# Patient Record
Sex: Female | Born: 1995 | Race: White | Hispanic: No | Marital: Single
Health system: Midwestern US, Community
[De-identification: ages and names within clinical notes are randomized; demographics above are authoritative.]

## PROBLEM LIST (undated history)

## (undated) DIAGNOSIS — L732 Hidradenitis suppurativa: Secondary | ICD-10-CM

## (undated) DIAGNOSIS — F319 Bipolar disorder, unspecified: Secondary | ICD-10-CM

## (undated) DIAGNOSIS — E669 Obesity, unspecified: Secondary | ICD-10-CM

## (undated) DIAGNOSIS — E785 Hyperlipidemia, unspecified: Secondary | ICD-10-CM

## (undated) DIAGNOSIS — N3944 Nocturnal enuresis: Secondary | ICD-10-CM

## (undated) DIAGNOSIS — N915 Oligomenorrhea, unspecified: Secondary | ICD-10-CM

## (undated) DIAGNOSIS — R011 Cardiac murmur, unspecified: Secondary | ICD-10-CM

## (undated) DIAGNOSIS — L680 Hirsutism: Secondary | ICD-10-CM

## (undated) HISTORY — DX: Obesity, unspecified: E66.9

## (undated) HISTORY — DX: Cardiac murmur, unspecified: R01.1

## (undated) HISTORY — DX: Bipolar disorder, unspecified: F31.9

## (undated) HISTORY — DX: Nocturnal enuresis: N39.44

## (undated) HISTORY — DX: Hirsutism: L68.0

## (undated) HISTORY — DX: Oligomenorrhea, unspecified: N91.5

## (undated) HISTORY — DX: Hidradenitis suppurativa: L73.2

## (undated) HISTORY — DX: Hyperlipidemia, unspecified: E78.5

---
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
## 2015-03-31 ENCOUNTER — Emergency Department (HOSPITAL_COMMUNITY)
Admission: EM | Admit: 2015-03-31 | Discharge: 2015-03-31 | Discharge status: Against Medical Advice | Payer: Medicaid Other | Attending: Emergency Medicine | Admitting: Emergency Medicine

## 2015-03-31 ENCOUNTER — Encounter (HOSPITAL_COMMUNITY): Payer: Self-pay | Admitting: Family Medicine

## 2015-03-31 DIAGNOSIS — R110 Nausea: Secondary | ICD-10-CM | POA: Insufficient documentation

## 2015-03-31 DIAGNOSIS — R39198 Other difficulties with micturition: Secondary | ICD-10-CM | POA: Diagnosis not present

## 2015-03-31 DIAGNOSIS — E669 Obesity, unspecified: Secondary | ICD-10-CM | POA: Insufficient documentation

## 2015-03-31 DIAGNOSIS — Z3202 Encounter for pregnancy test, result negative: Secondary | ICD-10-CM | POA: Diagnosis not present

## 2015-03-31 DIAGNOSIS — R109 Unspecified abdominal pain: Secondary | ICD-10-CM | POA: Insufficient documentation

## 2015-03-31 DIAGNOSIS — Z5321 Procedure and treatment not carried out due to patient leaving prior to being seen by health care provider: Secondary | ICD-10-CM

## 2015-03-31 LAB — COMPREHENSIVE METABOLIC PANEL
ALBUMIN: 3.4 g/dL — AB (ref 3.5–5.0)
ALT: 21 U/L (ref 14–54)
AST: 24 U/L (ref 15–41)
Alkaline Phosphatase: 60 U/L (ref 38–126)
Anion gap: 12 (ref 5–15)
BILIRUBIN TOTAL: 0.3 mg/dL (ref 0.3–1.2)
BUN: 9 mg/dL (ref 6–20)
CHLORIDE: 104 mmol/L (ref 101–111)
CO2: 23 mmol/L (ref 22–32)
CREATININE: 0.79 mg/dL (ref 0.44–1.00)
Calcium: 9.3 mg/dL (ref 8.9–10.3)
GFR calc Af Amer: >60 mL/min (ref >60)
GLUCOSE: 126 mg/dL — AB (ref 65–99)
Potassium: 3.8 mmol/L (ref 3.5–5.1)
Sodium: 139 mmol/L (ref 135–145)
Total Protein: 7 g/dL (ref 6.5–8.1)

## 2015-03-31 LAB — CBC
HCT: 37.3 % (ref 36.0–46.0)
Hemoglobin: 12.1 g/dL (ref 12.0–15.0)
MCH: 28.5 pg (ref 26.0–34.0)
MCHC: 32.4 g/dL (ref 30.0–36.0)
MCV: 88 fL (ref 78.0–100.0)
Platelets: 419 10*3/uL — ABNORMAL HIGH (ref 150–400)
RBC: 4.24 MIL/uL (ref 3.87–5.11)
RDW: 13.6 % (ref 11.5–15.5)
WBC: 11.6 10*3/uL — AB (ref 4.0–10.5)

## 2015-03-31 LAB — I-STAT BETA HCG BLOOD, ED (MC, WL, AP ONLY): I-stat hCG, quantitative: <5 m[IU]/mL (ref <5)

## 2015-03-31 LAB — LIPASE, BLOOD: LIPASE: 19 U/L (ref 11–51)

## 2015-03-31 NOTE — ED Notes (Signed)
No answer for room ([facility]

## 2015-03-31 NOTE — ED Notes (Signed)
Called pt to be roomed 3X more, no response. Unable to find pt in department at this time.

## 2015-03-31 NOTE — ED Notes (Signed)
Pt here for flank pain and nausea since last night. sts some burning with urination. Denies vaginal bleeding or discharge.

## 2015-04-03 NOTE — ED Provider Notes (Signed)
Appears, per nursing notes, the patient left without being seen.   Marily MemosJason Canden Cieslinski, MD 04/03/15 1739

## 2015-08-24 ENCOUNTER — Ambulatory Visit (INDEPENDENT_AMBULATORY_CARE_PROVIDER_SITE_OTHER): Payer: BLUE CROSS/BLUE SHIELD

## 2015-08-24 ENCOUNTER — Ambulatory Visit (INDEPENDENT_AMBULATORY_CARE_PROVIDER_SITE_OTHER): Payer: BLUE CROSS/BLUE SHIELD | Admitting: Physician Assistant

## 2015-08-24 VITALS — BP 122/76 | HR 112 | Temp 98.7°F | Resp 18 | Ht 66.75 in | Wt 292.0 lb

## 2015-08-24 DIAGNOSIS — Z111 Encounter for screening for respiratory tuberculosis: Secondary | ICD-10-CM

## 2015-08-24 DIAGNOSIS — N926 Irregular menstruation, unspecified: Secondary | ICD-10-CM

## 2015-08-24 DIAGNOSIS — R918 Other nonspecific abnormal finding of lung field: Secondary | ICD-10-CM | POA: Diagnosis not present

## 2015-08-24 LAB — POCT URINE PREGNANCY: Preg Test, Ur: NEGATIVE

## 2015-08-24 NOTE — Patient Instructions (Signed)
Consider quantiferon gold test next year

## 2015-08-24 NOTE — Progress Notes (Signed)
[facility] [address] [place] [address] [phone]  Date:  08/24/2015   Name:  Christy Ford   DOB:  May 30, 1995   MRN:  [id number]  PCP:  Gaye Alken, MD    Chief Complaint: Chest Xray   History of Present Illness:  This is a 20 y.o. female with PMH substance abuse who is presenting stating she needs a chest xray to screen for tb. She has apparently been positive to ppd 4 times in the past, each time with a negative xray. One provider before suggested she may be allergic to the ppd and that's why she reacts each time. She has been instructed by her job to get a chest xray for screening. She denies cough, fever, chills, night sweats, chest pain, wt loss.  Review of Systems:  Review of Systems See HPOI  Patient Active Problem List   Diagnosis Date Noted  . Primary nocturnal enuresis 05/07/2012  . Obesity   . Hyperlipidemia   . Hirsutism   . Oligomenorrhea     Prior to Admission medications   Medication Sig Start Date End Date Taking? Authorizing Provider  desmopressin (DDAVP) 0.1 MG tablet Take 1 tablet (0.1 mg total) by mouth daily. Patient not taking: Reported on 08/24/2015 05/07/12   Dessa Phi, MD  metFORMIN (GLUCOPHAGE) 500 MG tablet Take 1 tablet (500 mg total) by mouth 2 (two) times daily with a meal. Patient not taking: Reported on 08/24/2015 05/07/12   Dessa Phi, MD    Allergies  Allergen Reactions  . Amoxicillin Nausea Only  . Hctz [Hydrochlorothiazide] Nausea And Vomiting  . Penicillins Nausea Only  . Ppd [Tuberculin Purified Protein Derivative]   . Latex Itching and Rash    No past surgical history on file.  Social History  Substance Use Topics  . Smoking status: Former Smoker    Quit date: 05/17/2014  . Smokeless tobacco: Never Used  . Alcohol Use: No    Family History  Problem Relation Age of Onset  . Obesity Mother   . Diabetes Father   . Hypertension Father   . Diabetes Paternal Uncle   .  Hypertension Paternal Uncle   . Obesity Paternal Uncle     Medication list has been reviewed and updated.  Physical Examination:  Physical Exam  Constitutional: She is oriented to person, place, and time. She appears well-developed and well-nourished. No distress.  HENT:  Head: Normocephalic and atraumatic.  Right Ear: Hearing normal.  Left Ear: Hearing normal.  Nose: Nose normal.  Eyes: Conjunctivae and lids are normal. Right eye exhibits no discharge. Left eye exhibits no discharge. No scleral icterus.  Pulmonary/Chest: Effort normal. No respiratory distress.  Musculoskeletal: Normal range of motion.  Neurological: She is alert and oriented to person, place, and time.  Skin: Skin is warm, dry and intact. No lesion and no rash noted.  Psychiatric: She has a normal mood and affect. Her speech is normal and behavior is normal. Thought content normal.   BP 122/76 mmHg  Pulse 112  Temp(Src) 98.7 F (37.1 C) (Oral)  Resp 18  Ht 5' 6.75" (1.695 m)  Wt 292 lb (132.45 kg)  BMI 46.10 kg/m2  SpO2 97%  Results for orders placed or performed in visit on 08/24/15  POCT urine pregnancy  Result Value Ref Range   Preg Test, Ur Negative Negative   Assessment and Plan:  1. Irregular menstrual cycle 2. Screening pulmonary-TB Chest xray negative. I have suggested that patient get  quantiferon gold next year if her job will accept that, so that we reduce her radiation risk. - POCT urine pregnancy - DG Chest 2 View; Future   [place] MinersNicole V. Dyke BrackettBush, PA-C, MHS [facility]  08/24/2015

## 2016-03-07 DIAGNOSIS — J0110 Acute frontal sinusitis, unspecified: Secondary | ICD-10-CM | POA: Diagnosis not present

## 2016-03-07 DIAGNOSIS — R05 Cough: Secondary | ICD-10-CM | POA: Diagnosis not present

## 2016-03-15 DIAGNOSIS — J0100 Acute maxillary sinusitis, unspecified: Secondary | ICD-10-CM | POA: Diagnosis not present

## 2016-03-15 DIAGNOSIS — R05 Cough: Secondary | ICD-10-CM | POA: Diagnosis not present

## 2016-07-22 DIAGNOSIS — R51 Headache: Secondary | ICD-10-CM | POA: Diagnosis not present

## 2016-07-22 DIAGNOSIS — H53149 Visual discomfort, unspecified: Secondary | ICD-10-CM | POA: Diagnosis not present

## 2016-07-22 DIAGNOSIS — H538 Other visual disturbances: Secondary | ICD-10-CM | POA: Diagnosis not present

## 2016-07-22 DIAGNOSIS — H547 Unspecified visual loss: Secondary | ICD-10-CM | POA: Diagnosis not present

## 2016-07-22 DIAGNOSIS — H5712 Ocular pain, left eye: Secondary | ICD-10-CM | POA: Diagnosis not present

## 2016-07-22 DIAGNOSIS — H578 Other specified disorders of eye and adnexa: Secondary | ICD-10-CM | POA: Diagnosis not present

## 2016-07-24 DIAGNOSIS — H1132 Conjunctival hemorrhage, left eye: Secondary | ICD-10-CM | POA: Diagnosis not present

## 2016-07-24 DIAGNOSIS — H34821 Venous engorgement, right eye: Secondary | ICD-10-CM | POA: Diagnosis not present

## 2016-07-24 DIAGNOSIS — H539 Unspecified visual disturbance: Secondary | ICD-10-CM | POA: Diagnosis not present

## 2016-11-19 DIAGNOSIS — Z720 Tobacco use: Secondary | ICD-10-CM | POA: Diagnosis not present

## 2016-11-19 DIAGNOSIS — R0683 Snoring: Secondary | ICD-10-CM | POA: Diagnosis not present

## 2016-11-19 DIAGNOSIS — G932 Benign intracranial hypertension: Secondary | ICD-10-CM | POA: Diagnosis not present

## 2016-12-05 DIAGNOSIS — E785 Hyperlipidemia, unspecified: Secondary | ICD-10-CM | POA: Diagnosis not present

## 2016-12-05 DIAGNOSIS — Z713 Dietary counseling and surveillance: Secondary | ICD-10-CM | POA: Diagnosis not present

## 2016-12-16 DIAGNOSIS — F3175 Bipolar disorder, in partial remission, most recent episode depressed: Secondary | ICD-10-CM | POA: Diagnosis not present

## 2016-12-16 DIAGNOSIS — F4310 Post-traumatic stress disorder, unspecified: Secondary | ICD-10-CM | POA: Diagnosis not present

## 2016-12-16 DIAGNOSIS — Z7189 Other specified counseling: Secondary | ICD-10-CM | POA: Diagnosis not present

## 2016-12-20 DIAGNOSIS — F3175 Bipolar disorder, in partial remission, most recent episode depressed: Secondary | ICD-10-CM | POA: Diagnosis not present

## 2016-12-20 DIAGNOSIS — E785 Hyperlipidemia, unspecified: Secondary | ICD-10-CM | POA: Diagnosis not present

## 2016-12-20 DIAGNOSIS — G932 Benign intracranial hypertension: Secondary | ICD-10-CM | POA: Diagnosis not present

## 2016-12-24 DIAGNOSIS — J45909 Unspecified asthma, uncomplicated: Secondary | ICD-10-CM | POA: Diagnosis not present

## 2016-12-24 DIAGNOSIS — R0683 Snoring: Secondary | ICD-10-CM | POA: Diagnosis not present

## 2016-12-24 DIAGNOSIS — G4719 Other hypersomnia: Secondary | ICD-10-CM | POA: Diagnosis not present

## 2016-12-24 DIAGNOSIS — F319 Bipolar disorder, unspecified: Secondary | ICD-10-CM | POA: Diagnosis not present

## 2017-01-06 DIAGNOSIS — N926 Irregular menstruation, unspecified: Secondary | ICD-10-CM | POA: Diagnosis not present

## 2017-01-06 DIAGNOSIS — Z131 Encounter for screening for diabetes mellitus: Secondary | ICD-10-CM | POA: Diagnosis not present

## 2017-01-06 DIAGNOSIS — Z118 Encounter for screening for other infectious and parasitic diseases: Secondary | ICD-10-CM | POA: Diagnosis not present

## 2017-01-06 DIAGNOSIS — Z01419 Encounter for gynecological examination (general) (routine) without abnormal findings: Secondary | ICD-10-CM | POA: Diagnosis not present

## 2017-01-06 DIAGNOSIS — Z1329 Encounter for screening for other suspected endocrine disorder: Secondary | ICD-10-CM | POA: Diagnosis not present

## 2017-01-06 DIAGNOSIS — Z0000 Encounter for general adult medical examination without abnormal findings: Secondary | ICD-10-CM | POA: Diagnosis not present

## 2017-01-06 DIAGNOSIS — Z13220 Encounter for screening for lipoid disorders: Secondary | ICD-10-CM | POA: Diagnosis not present

## 2017-01-06 DIAGNOSIS — N915 Oligomenorrhea, unspecified: Secondary | ICD-10-CM | POA: Diagnosis not present

## 2017-01-06 DIAGNOSIS — Z6841 Body Mass Index (BMI) 40.0 and over, adult: Secondary | ICD-10-CM | POA: Diagnosis not present

## 2017-01-06 DIAGNOSIS — Z130 Encounter for screening for diseases of the blood and blood-forming organs and certain disorders involving the immune mechanism: Secondary | ICD-10-CM | POA: Diagnosis not present

## 2017-01-23 DIAGNOSIS — Z713 Dietary counseling and surveillance: Secondary | ICD-10-CM | POA: Diagnosis not present

## 2017-01-23 DIAGNOSIS — E785 Hyperlipidemia, unspecified: Secondary | ICD-10-CM | POA: Diagnosis not present

## 2017-01-27 DIAGNOSIS — E282 Polycystic ovarian syndrome: Secondary | ICD-10-CM | POA: Diagnosis not present

## 2017-01-27 DIAGNOSIS — G4719 Other hypersomnia: Secondary | ICD-10-CM | POA: Diagnosis not present

## 2017-01-27 DIAGNOSIS — J45909 Unspecified asthma, uncomplicated: Secondary | ICD-10-CM | POA: Diagnosis not present

## 2017-01-27 DIAGNOSIS — Z6841 Body Mass Index (BMI) 40.0 and over, adult: Secondary | ICD-10-CM | POA: Diagnosis not present

## 2017-01-27 DIAGNOSIS — R0683 Snoring: Secondary | ICD-10-CM | POA: Diagnosis not present

## 2017-01-27 DIAGNOSIS — F17210 Nicotine dependence, cigarettes, uncomplicated: Secondary | ICD-10-CM | POA: Diagnosis not present

## 2017-02-09 DIAGNOSIS — G4710 Hypersomnia, unspecified: Secondary | ICD-10-CM | POA: Diagnosis not present

## 2017-10-27 ENCOUNTER — Encounter (HOSPITAL_COMMUNITY): Payer: Self-pay

## 2017-10-27 ENCOUNTER — Emergency Department (HOSPITAL_COMMUNITY): Payer: BLUE CROSS/BLUE SHIELD

## 2017-10-27 ENCOUNTER — Emergency Department (HOSPITAL_COMMUNITY)
Admission: EM | Admit: 2017-10-27 | Discharge: 2017-10-27 | Disposition: A | Discharge status: Home / Self Care | Payer: BLUE CROSS/BLUE SHIELD | Attending: Emergency Medicine | Admitting: Emergency Medicine

## 2017-10-27 ENCOUNTER — Other Ambulatory Visit: Payer: Self-pay

## 2017-10-27 DIAGNOSIS — Z7984 Long term (current) use of oral hypoglycemic drugs: Secondary | ICD-10-CM | POA: Diagnosis not present

## 2017-10-27 DIAGNOSIS — M549 Dorsalgia, unspecified: Secondary | ICD-10-CM | POA: Insufficient documentation

## 2017-10-27 DIAGNOSIS — S199XXA Unspecified injury of neck, initial encounter: Secondary | ICD-10-CM | POA: Diagnosis not present

## 2017-10-27 DIAGNOSIS — R51 Headache: Secondary | ICD-10-CM | POA: Insufficient documentation

## 2017-10-27 DIAGNOSIS — S0990XA Unspecified injury of head, initial encounter: Secondary | ICD-10-CM | POA: Diagnosis not present

## 2017-10-27 DIAGNOSIS — Y9389 Activity, other specified: Secondary | ICD-10-CM | POA: Diagnosis not present

## 2017-10-27 DIAGNOSIS — S46911A Strain of unspecified muscle, fascia and tendon at shoulder and upper arm level, right arm, initial encounter: Secondary | ICD-10-CM | POA: Diagnosis not present

## 2017-10-27 DIAGNOSIS — M542 Cervicalgia: Secondary | ICD-10-CM | POA: Diagnosis not present

## 2017-10-27 DIAGNOSIS — M25511 Pain in right shoulder: Secondary | ICD-10-CM | POA: Insufficient documentation

## 2017-10-27 DIAGNOSIS — T148XXA Other injury of unspecified body region, initial encounter: Secondary | ICD-10-CM

## 2017-10-27 DIAGNOSIS — Y92410 Unspecified street and highway as the place of occurrence of the external cause: Secondary | ICD-10-CM | POA: Insufficient documentation

## 2017-10-27 DIAGNOSIS — Z87891 Personal history of nicotine dependence: Secondary | ICD-10-CM | POA: Diagnosis not present

## 2017-10-27 DIAGNOSIS — Y999 Unspecified external cause status: Secondary | ICD-10-CM | POA: Diagnosis not present

## 2017-10-27 DIAGNOSIS — S4991XA Unspecified injury of right shoulder and upper arm, initial encounter: Secondary | ICD-10-CM | POA: Diagnosis not present

## 2017-10-27 DIAGNOSIS — S161XXA Strain of muscle, fascia and tendon at neck level, initial encounter: Secondary | ICD-10-CM | POA: Diagnosis not present

## 2017-10-27 DIAGNOSIS — R079 Chest pain, unspecified: Secondary | ICD-10-CM | POA: Diagnosis not present

## 2017-10-27 DIAGNOSIS — S299XXA Unspecified injury of thorax, initial encounter: Secondary | ICD-10-CM | POA: Diagnosis not present

## 2017-10-27 MED ORDER — METHOCARBAMOL 500 MG PO TABS: 500.0000 mg | ORAL_TABLET | Freq: Two times a day (BID) | ORAL | 0 refills | Status: DC

## 2017-10-27 MED ORDER — NAPROXEN 500 MG PO TABS: 500.0000 mg | ORAL_TABLET | Freq: Two times a day (BID) | ORAL | 0 refills | Status: DC

## 2017-10-27 MED ORDER — HYDROCODONE-ACETAMINOPHEN 5-325 MG PO TABS
2.0000 | ORAL_TABLET | Freq: Once | ORAL | Status: AC
  Administered 2017-10-27: 2 via ORAL
  Filled 2017-10-27: qty 2
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
## 2017-11-25 DIAGNOSIS — M25571 Pain in right ankle and joints of right foot: Secondary | ICD-10-CM | POA: Diagnosis not present

## 2017-11-25 DIAGNOSIS — Z6841 Body Mass Index (BMI) 40.0 and over, adult: Secondary | ICD-10-CM | POA: Diagnosis not present

## 2017-12-12 ENCOUNTER — Other Ambulatory Visit: Payer: Self-pay

## 2017-12-12 ENCOUNTER — Encounter (HOSPITAL_COMMUNITY): Payer: Self-pay

## 2017-12-12 DIAGNOSIS — Y9302 Activity, running: Secondary | ICD-10-CM | POA: Diagnosis not present

## 2017-12-12 DIAGNOSIS — Y999 Unspecified external cause status: Secondary | ICD-10-CM | POA: Insufficient documentation

## 2017-12-12 DIAGNOSIS — W2209XA Striking against other stationary object, initial encounter: Secondary | ICD-10-CM | POA: Diagnosis not present

## 2017-12-12 DIAGNOSIS — F1290 Cannabis use, unspecified, uncomplicated: Secondary | ICD-10-CM | POA: Diagnosis not present

## 2017-12-12 DIAGNOSIS — Z87891 Personal history of nicotine dependence: Secondary | ICD-10-CM | POA: Diagnosis not present

## 2017-12-12 DIAGNOSIS — Y9289 Other specified places as the place of occurrence of the external cause: Secondary | ICD-10-CM | POA: Diagnosis not present

## 2017-12-12 DIAGNOSIS — R51 Headache: Secondary | ICD-10-CM | POA: Diagnosis not present

## 2017-12-12 DIAGNOSIS — S0990XA Unspecified injury of head, initial encounter: Secondary | ICD-10-CM | POA: Insufficient documentation

## 2017-12-12 DIAGNOSIS — H532 Diplopia: Secondary | ICD-10-CM | POA: Insufficient documentation

## 2017-12-12 DIAGNOSIS — Z91040 Latex allergy status: Secondary | ICD-10-CM | POA: Insufficient documentation
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
## 2017-12-13 ENCOUNTER — Emergency Department (HOSPITAL_COMMUNITY): Payer: BLUE CROSS/BLUE SHIELD

## 2017-12-13 ENCOUNTER — Emergency Department (HOSPITAL_COMMUNITY)
Admission: EM | Admit: 2017-12-13 | Discharge: 2017-12-13 | Disposition: A | Discharge status: Home / Self Care | Payer: BLUE CROSS/BLUE SHIELD | Attending: Emergency Medicine | Admitting: Emergency Medicine

## 2017-12-13 DIAGNOSIS — S0990XA Unspecified injury of head, initial encounter: Secondary | ICD-10-CM

## 2017-12-13 DIAGNOSIS — R51 Headache: Secondary | ICD-10-CM | POA: Diagnosis not present

## 2017-12-13 MED ORDER — ONDANSETRON 4 MG PO TBDP: 4.0000 mg | ORAL_TABLET | Freq: Three times a day (TID) | ORAL | 0 refills | Status: DC | PRN

## 2017-12-13 MED ORDER — IBUPROFEN 800 MG PO TABS: 800.0000 mg | ORAL_TABLET | Freq: Three times a day (TID) | ORAL | 0 refills | Status: DC

## 2017-12-13 NOTE — ED Provider Notes (Signed)
[facility]-EMERGENCY DEPT Provider Note   CSN: [id number] Arrival date & time: 12/12/17  2336     History   Chief Complaint Chief Complaint  Patient presents with  . Head Injury    HPI Christy Ford is a 22 y.o. female.  The history is provided by the patient and medical records.     22 year old female with history of hyperlipidemia, hirsutism, obesity, presenting to the ED after multiple head injuries.  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  States the first was when she was in a cage, was climbing to the top, fell and struck her head on the ground.  Next incidence also occurred in the cage but ran straight into a metal pole and full speed.  Lastly, she accidentally hit herself in the back of the head with a metal bat.  She denies LOC with any of these episodes but reports as the night has gone on she has started to feel "woozy".  States initially she had some confusion, nausea, vomiting, and blurred vision but those symptoms seem to have improved at this time.  She has not had any focal numbness or weakness.  She is not currently on any anticoagulation.  She does report history of one concussion in the past, many years ago, but never had any ongoing problems with this.  States by the time of my evaluation she mostly just wants to go to sleep.  Past Medical History:  Diagnosis Date  . Hirsutism   . Hyperlipidemia   . Obesity   . Oligomenorrhea   . Primary nocturnal enuresis 05/07/2012    Patient Active Problem List   Diagnosis Date Noted  . Primary nocturnal enuresis 05/07/2012  . Obesity   . Hyperlipidemia   . Hirsutism   . Oligomenorrhea     History reviewed. No pertinent surgical history.   OB History   None      Home Medications    Prior to Admission medications   Medication Sig Start Date End Date Taking? Authorizing Provider  desmopressin  (DDAVP) 0.1 MG tablet Take 1 tablet (0.1 mg total) by mouth daily. Patient not taking: Reported on 08/24/2015 05/07/12   Dessa Phi, MD  metFORMIN (GLUCOPHAGE) 500 MG tablet Take 1 tablet (500 mg total) by mouth 2 (two) times daily with a meal. Patient not taking: Reported on 08/24/2015 05/07/12   Dessa Phi, MD  methocarbamol (ROBAXIN) 500 MG tablet Take 1 tablet (500 mg total) by mouth 2 (two) times daily. 10/27/17   Maxwell Caul, PA-C  naproxen (NAPROSYN) 500 MG tablet Take 1 tablet (500 mg total) by mouth 2 (two) times daily. 10/27/17   Maxwell Caul, PA-C    Family History Family History  Problem Relation Age of Onset  . Obesity Mother   . Diabetes Father   . Hypertension Father   . Diabetes Paternal Uncle   . Hypertension Paternal Uncle   . Obesity Paternal Uncle     Social History Social History   Tobacco Use  . Smoking status: Former Smoker    Last attempt to quit: 05/17/2014    Years since quitting: 3.5  . Smokeless tobacco: Never Used  Substance Use Topics  . Alcohol use: No  . Drug use: Yes    Types: Marijuana     Allergies   Amoxicillin; Hctz [hydrochlorothiazide]; Penicillins; Ppd [tuberculin purified protein derivative]; and Latex   Review  of Systems Review of Systems  All other systems reviewed and are negative.    Physical Exam Updated Vital Signs BP 120/66   Pulse 76   Temp 98.9 F (37.2 C) (Oral)   Resp 17   Ht 5\' 4"  (1.626 m)   SpO2 97%   BMI 42.91 kg/m   Physical Exam  Constitutional: She is oriented to person, place, and time. She appears well-developed and well-nourished. No distress.  HENT:  Head: Normocephalic and atraumatic.  Right Ear: External ear normal.  Left Ear: External ear normal.  Mouth/Throat: Oropharynx is clear and moist.  Diffuse tenderness across the forehead without noted hematoma, bruising, or focal areas of contusion  Eyes: Pupils are equal, round, and reactive to light. Conjunctivae and EOM are normal.   PERRL  Neck: Normal range of motion and full passive range of motion without pain. Neck supple. No neck rigidity.  No rigidity, no meningismus  Cardiovascular: Normal rate, regular rhythm and normal heart sounds.  No murmur heard. Pulmonary/Chest: Effort normal and breath sounds normal. No stridor. No respiratory distress. She has no wheezes. She has no rhonchi.  Abdominal: Soft. Bowel sounds are normal. There is no tenderness. There is no rebound and no guarding.  Musculoskeletal: Normal range of motion. She exhibits no edema.  Neurological: She is alert and oriented to person, place, and time. She has normal strength. She displays no tremor. No cranial nerve deficit or sensory deficit. She displays no seizure activity.  AAOx3, answering questions and following commands appropriately; equal strength UE and LE bilaterally; CN grossly intact; moves all extremities appropriately without ataxia; no focal neuro deficits or facial asymmetry appreciated  Skin: Skin is warm and dry. No rash noted. She is not diaphoretic.  Psychiatric: Her behavior is normal. Thought content normal. Her mood appears anxious.  Very anxious and tearful during exam  Nursing note and vitals reviewed.    ED Treatments / Results  Labs (all labs ordered are listed, but only abnormal results are displayed) Labs Reviewed - No data to display  EKG None  Radiology Ct Head Wo Contrast  Result Date: 12/13/2017 CLINICAL DATA:  Fall, head trauma, headache EXAM: CT HEAD WITHOUT CONTRAST TECHNIQUE: Contiguous axial images were obtained from the base of the skull through the vertex without intravenous contrast. COMPARISON:  10/27/2017 FINDINGS: Brain: No evidence of acute infarction, hemorrhage, hydrocephalus, extra-axial collection or mass lesion/mass effect. Vascular: No hyperdense vessel or unexpected calcification. Skull: Normal. Negative for fracture or focal lesion. Sinuses/Orbits: The visualized paranasal sinuses are  essentially clear. The mastoid air cells are unopacified. Other: None. IMPRESSION: Normal head CT. Electronically Signed   By: Charline Bills M.D.   On: 12/13/2017 02:17    Procedures Procedures (including critical care time)  Medications Ordered in ED Medications - No data to display   Initial Impression / Assessment and Plan / ED Course  I have reviewed the triage vital signs and the nursing notes.  Pertinent labs & imaging results that were available during my care of the patient were reviewed by me and considered in my medical decision making (see chart for details).  22 year old female presenting to the ED following several head injuries today.  No loss of consciousness.  She is awake, alert, appropriately oriented here.  She does appear very anxious and tearful.  No focal neurologic deficits.  No significant signs of head trauma externally on exam.  Does report some confusion and vomiting earlier this evening, has since resolved.  Screening head CT  obtained and is negative for acute findings.  Suspect concussion.  Will treat symptomatically.  We have discussed importance of rest, limited phone/iPad/computer use, reading, etc. and other activities that can cause eyestrain potentially worsening headache.  She will follow-up closely with her primary care doctor.  She can return here for any new or worsening symptoms.  Final Clinical Impressions(s) / ED Diagnoses   Final diagnoses:  Injury of head, initial encounter    ED Discharge Orders         Ordered    ibuprofen (ADVIL,MOTRIN) 800 MG tablet  3 times daily     12/13/17 0306    ondansetron (ZOFRAN ODT) 4 MG disintegrating tablet  Every 8 hours PRN     12/13/17 0306           Garlon Hatchet, PA-C 12/13/17 1610    Zadie Rhine, MD 12/13/17 [phone]

## 2017-12-13 NOTE — Discharge Instructions (Signed)
Head CT was negative.  We feel you likely have a concussion. Take the prescribed medication as directed.  Make sure to rest.  Try to avoid phones, computers, reading, etc as this can make headaches worse and strain the eyes. Follow-up with your primary care doctor. Return to the ED for new or worsening symptoms.

## 2019-01-12 DIAGNOSIS — L03116 Cellulitis of left lower limb: Secondary | ICD-10-CM | POA: Diagnosis not present

## 2019-01-14 DIAGNOSIS — L02416 Cutaneous abscess of left lower limb: Secondary | ICD-10-CM | POA: Diagnosis not present

## 2019-01-14 DIAGNOSIS — L03116 Cellulitis of left lower limb: Secondary | ICD-10-CM | POA: Diagnosis not present

## 2019-03-19 DIAGNOSIS — R7309 Other abnormal glucose: Secondary | ICD-10-CM

## 2019-03-19 DIAGNOSIS — R7989 Other specified abnormal findings of blood chemistry: Secondary | ICD-10-CM

## 2019-03-19 HISTORY — DX: Other abnormal glucose: R73.09

## 2019-03-19 HISTORY — DX: Other specified abnormal findings of blood chemistry: R79.89

## 2019-05-15 DIAGNOSIS — M791 Myalgia, unspecified site: Secondary | ICD-10-CM | POA: Diagnosis not present

## 2019-05-15 DIAGNOSIS — R5383 Other fatigue: Secondary | ICD-10-CM | POA: Diagnosis not present

## 2019-05-15 DIAGNOSIS — R110 Nausea: Secondary | ICD-10-CM | POA: Diagnosis not present

## 2019-05-15 DIAGNOSIS — Z20822 Contact with and (suspected) exposure to covid-19: Secondary | ICD-10-CM | POA: Diagnosis not present

## 2019-05-18 DIAGNOSIS — J019 Acute sinusitis, unspecified: Secondary | ICD-10-CM | POA: Diagnosis not present

## 2019-05-18 DIAGNOSIS — Z20822 Contact with and (suspected) exposure to covid-19: Secondary | ICD-10-CM | POA: Diagnosis not present

## 2019-05-18 DIAGNOSIS — R52 Pain, unspecified: Secondary | ICD-10-CM | POA: Diagnosis not present

## 2019-06-14 DIAGNOSIS — F329 Major depressive disorder, single episode, unspecified: Secondary | ICD-10-CM | POA: Diagnosis not present

## 2019-06-14 DIAGNOSIS — F603 Borderline personality disorder: Secondary | ICD-10-CM | POA: Diagnosis not present

## 2019-06-14 DIAGNOSIS — F419 Anxiety disorder, unspecified: Secondary | ICD-10-CM | POA: Diagnosis not present

## 2019-09-10 DIAGNOSIS — L0291 Cutaneous abscess, unspecified: Secondary | ICD-10-CM | POA: Diagnosis not present

## 2019-09-10 DIAGNOSIS — R000 Tachycardia, unspecified: Secondary | ICD-10-CM | POA: Diagnosis not present

## 2019-09-11 DIAGNOSIS — L0291 Cutaneous abscess, unspecified: Secondary | ICD-10-CM | POA: Diagnosis not present

## 2019-09-22 DIAGNOSIS — E282 Polycystic ovarian syndrome: Secondary | ICD-10-CM | POA: Diagnosis not present

## 2019-09-22 DIAGNOSIS — Z0000 Encounter for general adult medical examination without abnormal findings: Secondary | ICD-10-CM | POA: Diagnosis not present

## 2019-09-22 DIAGNOSIS — F17200 Nicotine dependence, unspecified, uncomplicated: Secondary | ICD-10-CM | POA: Diagnosis not present

## 2019-09-22 DIAGNOSIS — Z1389 Encounter for screening for other disorder: Secondary | ICD-10-CM | POA: Diagnosis not present

## 2019-11-25 DIAGNOSIS — L732 Hidradenitis suppurativa: Secondary | ICD-10-CM | POA: Diagnosis not present

## 2019-12-27 ENCOUNTER — Ambulatory Visit (INDEPENDENT_AMBULATORY_CARE_PROVIDER_SITE_OTHER): Payer: BC Managed Care – PPO | Admitting: Obstetrics and Gynecology

## 2019-12-27 ENCOUNTER — Encounter: Payer: Self-pay | Admitting: Obstetrics and Gynecology

## 2019-12-27 ENCOUNTER — Other Ambulatory Visit: Payer: Self-pay

## 2019-12-27 ENCOUNTER — Telehealth: Payer: Self-pay

## 2019-12-27 VITALS — BP 122/68 | HR 104 | Resp 20 | Ht 65.5 in | Wt 280.0 lb

## 2019-12-27 DIAGNOSIS — N926 Irregular menstruation, unspecified: Secondary | ICD-10-CM | POA: Diagnosis not present

## 2019-12-27 DIAGNOSIS — L680 Hirsutism: Secondary | ICD-10-CM

## 2019-12-27 DIAGNOSIS — Z6841 Body Mass Index (BMI) 40.0 and over, adult: Secondary | ICD-10-CM

## 2019-12-27 NOTE — Progress Notes (Signed)
GYNECOLOGY  VISIT   HPI: 24 y.o.   Single  Caucasian  female   No obstetric history on file. with Patient's last menstrual period was 12/23/2019 (approximate).  here for irregular cycles and consider possible PCOS.    Her dermatologist is seeing her for hydradenitis, and she was referred here for possible PCOs.  She is going to start spironolactone.   Menses are regular for 2 - 3 months and then can go for 6 months before having a menstruation again.   Cycles always irregular.  Menses 1 - 7 days.  Flow is light, pad changes twice a day.  Not a lot of cramping.  Menarche age 64 yo.   She has increased hair on her arms and chest and under her chin. She used to shave, and now she will wax or use Nair hair remover. She also has hair loss, hair thinning.   She has some some nipple discharge from both nipples.   She saw an endocrinologist and told her issues were related to her genetic makeup.   She used Metformin when she was in high school.  States she has sensory issues and difficulty with exams.   Denies HTN, liver or breast disease, migraine with aura, personal or family history of thromboembolic.   [employer] student, English major.  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   PCP:  Dr. Alphonzo Lemmings at [facility] on [address].  GYNECOLOGIC HISTORY: Patient's last menstrual period was 12/23/2019 (approximate). Contraception:  Condoms every time.  Her partner is Cis man.  Menopausal hormone therapy:  n/a Last mammogram:  n/a Last pap smear: 2017 normal per patient--first and only pap.        OB History   No obstetric history on file.        Patient Active Problem List   Diagnosis Date Noted  . Primary nocturnal enuresis 05/07/2012  . Obesity   . Hyperlipidemia   . Hirsutism   . Oligomenorrhea     Past Medical History:  Diagnosis Date  . Bipolar disorder (HCC)   . Heart murmur    as young child and has been told is resolved  .  Hirsutism   . Hyperlipidemia   . Obesity   . Oligomenorrhea   . Primary nocturnal enuresis 05/07/2012  . 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 No pertinent surgical history.  Current Outpatient Medications  Medication Sig Dispense Refill  . chlorhexidine (HIBICLENS) 4 % external liquid Scrub Chlorhexidine Gluconate 4 % topical liquid  Use in the shower twice a week and after shaving/waxing    . clindamycin (CLEOCIN T) 1 % lotion clindamycin 1 % lotion  APPLY A THIN LAYER TO THE AFFECTED AREA(S) BY TOPICAL ROUTE 2 TIMES PER DAY    . clindamycin (CLEOCIN) 300 MG capsule clindamycin HCl 300 mg capsule  TAKE 1 CAPSULE EVERY 6 HOURS BY ORAL ROUTE FOR 10 DAYS.    . fluticasone (FLONASE) 50 MCG/ACT nasal spray fluticasone propionate 50 mcg/actuation nasal spray,suspension  SPRAY 1 SPRAY INTO EACH NOSTRIL TWICE A DAY    . mupirocin ointment (BACTROBAN) 2 % mupirocin 2 % topical ointment  APPLY TO AFFECTED AREA 3 TIMES A DAY     No current facility-administered medications for this visit.     ALLERGIES: Amoxicillin, Hctz [hydrochlorothiazide], Penicillins, Ppd [tuberculin purified protein derivative], and Latex  Family History  Problem Relation Age of Onset  .  Obesity Mother   . Diabetes Father   . Hypertension Father   . Diabetes Paternal Uncle   . Hypertension Paternal Uncle   . Obesity Paternal Uncle   . Cancer Paternal Grandfather        Dec from colon cancer    Social History   Socioeconomic History  . Marital status: Single    Spouse name: Not on file  . Number of children: Not on file  . Years of education: Not on file  . Highest education level: Not on file  Occupational History  . Not on file  Tobacco Use  . Smoking status: Former Smoker    Quit date: 05/17/2014    Years since quitting: 5.6  . Smokeless tobacco: Never Used  Vaping Use  . Vaping Use: Some days  . Substances: Nicotine, Mixture of cannabinoids  Substance and Sexual  Activity  . Alcohol use: No  . Drug use: Yes    Frequency: 7.0 times per week    Types: Marijuana    Comment: vapes marijuana daily  . Sexual activity: Yes    Birth control/protection: Condom    Comment: condoms everytime  Other Topics Concern  . Not on file  Social History Narrative   Is in 11th grade at [employer] Lives with parents and brother. Dance 2 days a week. Plays XBox.    Social Determinants of Health   Financial Resource Strain:   . Difficulty of Paying Living Expenses: Not on file  Food Insecurity:   . Worried About Programme researcher, broadcasting/film/video in the Last Year: Not on file  . Ran Out of Food in the Last Year: Not on file  Transportation Needs:   . Lack of Transportation (Medical): Not on file  . Lack of Transportation (Non-Medical): Not on file  Physical Activity:   . Days of Exercise per Week: Not on file  . Minutes of Exercise per Session: Not on file  Stress:   . Feeling of Stress : Not on file  Social Connections:   . Frequency of Communication with Friends and Family: Not on file  . Frequency of Social Gatherings with Friends and Family: Not on file  . Attends Religious Services: Not on file  . Active Member of Clubs or Organizations: Not on file  . Attends Banker Meetings: Not on file  . Marital Status: Not on file  Intimate Partner Violence:   . Fear of Current or Ex-Partner: Not on file  . Emotionally Abused: Not on file  . Physically Abused: Not on file  . Sexually Abused: Not on file    Review of Systems  All other systems reviewed and are negative.   PHYSICAL EXAMINATION:    BP 122/68 (Cuff Size: Large)   Pulse (!) 104   Resp 20   Ht 5' 5.5" (1.664 m)   Wt 280 lb (127 kg)   LMP 12/23/2019 (Approximate)   BMI 45.89 kg/m     General appearance: alert, cooperative and appears stated age Head: Normocephalic, without obvious abnormality, atraumatic Neck: no adenopathy, supple, symmetrical, trachea midline and thyroid normal to  inspection and palpation Lungs: clear to auscultation bilaterally Breasts: normal appearance, no masses or tenderness, No nipple retraction or dimpling, No nipple discharge or bleeding, No axillary or supraclavicular adenopathy Heart: regular rate and rhythm Abdomen: soft, non-tender, no masses,  no organomegaly Extremities: extremities normal, atraumatic, no cyanosis or edema Skin: Skin color, texture, turgor normal. No rashes or lesions.  Increased hair on  chin, midline chest.  Lymph nodes: Cervical, supraclavicular, and axillary nodes normal. Neurologic: Grossly normal  Pelvic:  Deferred.  Chaperone was present for exam.  ASSESSMENT  Irregular menses.  Hirsutism.  Probable PCOS.  Difficulty with exams and blood draws.  Hidradenitis.   PLAN  We discussed hirsutism causes PCOS, hypothyroidism, hyperprolactinemia, adrenal disorders.   PCOS effects on health reviewed - irregular menses and heavy menstrual bleeding, increased risk of hyperplasia/uterine cancer, decreased fertility, increased risk of diabetes and cardiovascular disease. Return for blood work - TSH, prolactin, LH, FSH, estradiol, testosterone free and total, cortisol, DHEA-S, 17 OHP, A1C ,lipids. She is starting spironolactone through her dermatologist.  She will then return for her annual exam.

## 2019-12-27 NOTE — Telephone Encounter (Signed)
Patient needs to be seen 2 weeks after fasting labs(12/31/19) per Dr. Edward Jolly.

## 2019-12-27 NOTE — Telephone Encounter (Signed)
Left message to call Brookelin Felber, RN at [facility] [phone].   

## 2019-12-28 NOTE — Telephone Encounter (Signed)
Left message to call Wynn Alldredge, RN at [facility] [phone].   

## 2019-12-31 ENCOUNTER — Other Ambulatory Visit: Payer: Self-pay

## 2019-12-31 ENCOUNTER — Other Ambulatory Visit (INDEPENDENT_AMBULATORY_CARE_PROVIDER_SITE_OTHER): Payer: BC Managed Care – PPO

## 2019-12-31 DIAGNOSIS — L680 Hirsutism: Secondary | ICD-10-CM

## 2019-12-31 DIAGNOSIS — Z6841 Body Mass Index (BMI) 40.0 and over, adult: Secondary | ICD-10-CM | POA: Diagnosis not present

## 2019-12-31 NOTE — Telephone Encounter (Signed)
Patient was in office this morning said she is available to talk today and schedule appointment.

## 2019-12-31 NOTE — Telephone Encounter (Signed)
Left message to call Renezmae Canlas, RN at [facility] [phone].   

## 2020-01-01 LAB — LIPID PANEL: LDL Chol Calc (NIH): 173 mg/dL — ABNORMAL HIGH (ref 0–99)

## 2020-01-01 LAB — TESTOSTERONE, FREE, DIRECT

## 2020-01-04 LAB — TESTOSTERONE, FREE, DIRECT

## 2020-01-04 LAB — ESTRADIOL: Estradiol: 30.2 pg/mL

## 2020-01-04 LAB — HEMOGLOBIN A1C
Est. average glucose Bld gHb Est-mCnc: 117 mg/dL
Hgb A1c MFr Bld: 5.7 % — ABNORMAL HIGH (ref 4.8–5.6)

## 2020-01-04 LAB — TSH: TSH: 2.31 u[IU]/mL (ref 0.450–4.500)

## 2020-01-04 LAB — FSH/LH: FSH: 7.9 m[IU]/mL

## 2020-01-04 LAB — DHEA-SULFATE: DHEA-SO4: 333 ug/dL (ref 110.0–431.7)

## 2020-01-04 LAB — LIPID PANEL: HDL: 53 mg/dL (ref >39)

## 2020-01-05 LAB — LIPID PANEL
Chol/HDL Ratio: 4.6 ratio — ABNORMAL HIGH (ref 0.0–4.4)
Cholesterol, Total: 242 mg/dL — ABNORMAL HIGH (ref 100–199)
Triglycerides: 93 mg/dL (ref 0–149)
VLDL Cholesterol Cal: 16 mg/dL (ref 5–40)

## 2020-01-05 LAB — 17-HYDROXYPROGESTERONE: 17-Hydroxyprogesterone: 45 ng/dL

## 2020-01-05 LAB — CORTISOL: Cortisol: 14.8 ug/dL

## 2020-01-05 LAB — PROLACTIN: Prolactin: 43.9 ng/mL — ABNORMAL HIGH (ref 4.8–23.3)

## 2020-01-05 LAB — FSH/LH: LH: 10 m[IU]/mL

## 2020-01-10 NOTE — Telephone Encounter (Signed)
Spoke with patient. OV scheduled for 01/20/20 at 4:30pm with Dr. Edward Jolly.  Patient declined OV for 10/26 at 1pm, requesting OV for next week.  Patient is agreeable to date and time.   Routing to provider for final review. Patient is agreeable to disposition. Will close encounter.

## 2020-01-10 NOTE — Telephone Encounter (Signed)
Patient will need a fasting prolactin done.  Would she like to do her lab on a different day from her next visit? She likes to prepare in advance.

## 2020-01-11 NOTE — Telephone Encounter (Signed)
Spoke with patient. Advised per Dr. Edward Jolly. Patient request to r/s appt to a morning appt.  OV r/s to 01/27/20 at 8:30am with Dr. Edward Jolly. Advised nothing to eat or drink after midnight other than water or black coffee. Patient verbalizes understanding and is agreeable.   Routing to provider for final review. Patient is agreeable to disposition. Will close encounter.

## 2020-01-20 ENCOUNTER — Ambulatory Visit: Payer: Self-pay | Admitting: Obstetrics and Gynecology

## 2020-01-23 ENCOUNTER — Encounter: Payer: Self-pay | Admitting: Obstetrics and Gynecology

## 2020-01-25 NOTE — Progress Notes (Deleted)
24 y.o. No obstetric history on file. Single Caucasian female here for annual exam.    PCP:     No LMP recorded. (Menstrual status: Irregular Periods).           Sexually active: Yes.    The current method of family planning is condoms everytime.    Exercising: {yes no:314532}  {types:19826} Smoker:  former  Health Maintenance: Pap: 2017 normal per patient--first and only pap. History of abnormal Pap:  no MMG:  n/a Colonoscopy:  n/a BMD:   n/a  Result  n/a TDaP: *** Gardasil:   {YES NO:22349} HIV:*** Hep C:*** Screening Labs:  Hb today: ***, Urine today: ***   reports that she quit smoking about 5 years ago. She has never used smokeless tobacco. She reports current drug use. Frequency: 7.00 times per week. Drug: Marijuana. She reports that she does not drink alcohol.  Past Medical History:  Diagnosis Date  . Bipolar disorder (HCC)   . Heart murmur    as young child and has been told is resolved  . Hidradenitis suppurativa   . Hirsutism   . Hyperlipidemia   . Obesity   . Oligomenorrhea   . Primary nocturnal enuresis 05/07/2012  . 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  Current Outpatient Medications  Medication Sig Dispense Refill  . chlorhexidine (HIBICLENS) 4 % external liquid Scrub Chlorhexidine Gluconate 4 % topical liquid  Use in the shower twice a week and after shaving/waxing    . clindamycin (CLEOCIN T) 1 % lotion clindamycin 1 % lotion  APPLY A THIN LAYER TO THE AFFECTED AREA(S) BY TOPICAL ROUTE 2 TIMES PER DAY    . clindamycin (CLEOCIN) 300 MG capsule clindamycin HCl 300 mg capsule  TAKE 1 CAPSULE EVERY 6 HOURS BY ORAL ROUTE FOR 10 DAYS.    . fluticasone (FLONASE) 50 MCG/ACT nasal spray fluticasone propionate 50 mcg/actuation nasal spray,suspension  SPRAY 1 SPRAY INTO EACH NOSTRIL TWICE A DAY    . mupirocin ointment (BACTROBAN) 2 % mupirocin 2 % topical ointment  APPLY TO AFFECTED AREA 3 TIMES A DAY     No  current facility-administered medications for this visit.    Family History  Problem Relation Age of Onset  . Obesity Mother   . Diabetes Father   . Hypertension Father   . Diabetes Paternal Uncle   . Hypertension Paternal Uncle   . Obesity Paternal Uncle   . Cancer Paternal Grandfather        Dec from colon cancer    Review of Systems  Exam:   There were no vitals taken for this visit.    General appearance: alert, cooperative and appears stated age Head: normocephalic, without obvious abnormality, atraumatic Neck: no adenopathy, supple, symmetrical, trachea midline and thyroid normal to inspection and palpation Lungs: clear to auscultation bilaterally Breasts: normal appearance, no masses or tenderness, No nipple retraction or dimpling, No nipple discharge or bleeding, No axillary adenopathy Heart: regular rate and rhythm Abdomen: soft, non-tender; no masses, no organomegaly Extremities: extremities normal, atraumatic, no cyanosis or edema Skin: skin color, texture, turgor normal. No rashes or lesions Lymph nodes: cervical, supraclavicular, and axillary nodes normal. Neurologic: grossly normal  Pelvic: External genitalia:  no lesions              No abnormal inguinal nodes palpated.              Urethra:  normal appearing urethra  with no masses, tenderness or lesions              Bartholins and Skenes: normal                 Vagina: normal appearing vagina with normal color and discharge, no lesions              Cervix: no lesions              Pap taken: {yes no:314532} Bimanual Exam:  Uterus:  normal size, contour, position, consistency, mobility, non-tender              Adnexa: no mass, fullness, tenderness              Rectal exam: {yes no:314532}.  Confirms.              Anus:  normal sphincter tone, no lesions  Chaperone was present for exam.  Assessment:   Well woman visit with normal exam.   Plan: Mammogram screening discussed. Self breast awareness  reviewed. Pap and HR HPV as above. Guidelines for Calcium, Vitamin D, regular exercise program including cardiovascular and weight bearing exercise.   Follow up annually and prn.   Additional counseling given.  {yes [id number]. _______ minutes face to face time of which over 50% was spent in counseling.    After visit summary provided.

## 2020-01-26 ENCOUNTER — Encounter: Payer: Self-pay | Admitting: Obstetrics and Gynecology

## 2020-01-26 ENCOUNTER — Telehealth: Payer: Self-pay

## 2020-01-26 NOTE — Telephone Encounter (Signed)
Call placed to pt. Spoke with pt. Pt needing to reschedule appt due to school schedule. Pt rescheduled for 11/24 at 10 am with Dr Edward Jolly. Pt aware of fasting before appts for labs and consult. Pt agreeable and verbalized understanding to date and time of appt.  Encounter closed

## 2020-01-26 NOTE — Telephone Encounter (Signed)
Patient cancelled follow up visit for tomorrow (01/28/20) due to school. Patient would like to reschedule, need triage to assist.

## 2020-01-27 ENCOUNTER — Ambulatory Visit: Payer: Self-pay | Admitting: Obstetrics and Gynecology

## 2020-01-28 ENCOUNTER — Encounter: Payer: Self-pay | Admitting: Obstetrics and Gynecology

## 2020-01-31 ENCOUNTER — Telehealth: Payer: Self-pay

## 2020-01-31 NOTE — Telephone Encounter (Signed)
-----   Message from Patton Salles, MD sent at 01/28/2020  8:12 AM EST ----- Please contact patient with results of her blood work.  She rescheduled her appointment, and I want to be sure she has these results.   Her hormone testing supports polycystic ovarian disease - elevated testosterone and ratios of her hormones called LH and FSH.  Her dermatologist has already suspected this diagnosis.   Her prolactin level is elevated, and this can cause to irregular periods, nipple discharge, excess hair growth, and headaches with visual changes.  This needs to be retested with a fasting prolactin level to see if it continues to be elevated.  If it remains elevated, an MRI of the brain is recommended to understand if there is an enlargement of the brain called an adenoma.  A adenoma is not cancer, but it can cause headaches and loss of vision. Adenomas can be treated.   Her cholesterol is elevated. I recommend she begin a diet low in cholesterol and increase exercise to lower this and lower risk of future cardiovascular disease.   Her blood sugar is elevated, and she has prediabetes.  Following a diet low in sugar and carbohydrate will help to lower this and reduce chance of future diabetes.   Her thyroid, DHEAS, and cortisol levels were normal.

## 2020-01-31 NOTE — Telephone Encounter (Signed)
Patient is returning call.  °

## 2020-01-31 NOTE — Telephone Encounter (Signed)
Spoke with patient and reviewed all lab results below per Dr.Silva's note. She states she rescheduled her appointment due to her exam schedule. She will call back if she has any further questions, otherwise will keep appointment for 02-09-20 at 10:00am with Dr.Silva.

## 2020-01-31 NOTE — Telephone Encounter (Signed)
Left message to call Alcario Tinkey, CMA. °

## 2020-01-31 NOTE — Telephone Encounter (Signed)
Left message to return call to Triage.

## 2020-02-09 ENCOUNTER — Other Ambulatory Visit: Payer: Self-pay

## 2020-02-09 ENCOUNTER — Ambulatory Visit (INDEPENDENT_AMBULATORY_CARE_PROVIDER_SITE_OTHER): Payer: BC Managed Care – PPO | Admitting: Obstetrics and Gynecology

## 2020-02-09 ENCOUNTER — Other Ambulatory Visit (HOSPITAL_COMMUNITY)
Admission: RE | Admit: 2020-02-09 | Discharge: 2020-02-09 | Disposition: A | Discharge status: Home / Self Care | Payer: BC Managed Care – PPO | Source: Ambulatory Visit | Attending: Obstetrics and Gynecology | Admitting: Obstetrics and Gynecology

## 2020-02-09 ENCOUNTER — Encounter: Payer: Self-pay | Admitting: Obstetrics and Gynecology

## 2020-02-09 VITALS — BP 110/70 | HR 80 | Resp 18 | Ht 65.5 in | Wt 282.0 lb

## 2020-02-09 DIAGNOSIS — Z113 Encounter for screening for infections with a predominantly sexual mode of transmission: Secondary | ICD-10-CM

## 2020-02-09 DIAGNOSIS — R7309 Other abnormal glucose: Secondary | ICD-10-CM

## 2020-02-09 DIAGNOSIS — R7989 Other specified abnormal findings of blood chemistry: Secondary | ICD-10-CM

## 2020-02-09 DIAGNOSIS — Z124 Encounter for screening for malignant neoplasm of cervix: Secondary | ICD-10-CM | POA: Diagnosis not present

## 2020-02-09 MED ORDER — DROSPIRENONE-ETHINYL ESTRADIOL 3-0.03 MG PO TABS: 1.0000 | ORAL_TABLET | Freq: Every day | ORAL | 11 refills | Status: AC

## 2020-02-09 NOTE — Progress Notes (Signed)
GYNECOLOGY  VISIT   HPI: 24 y.o.   Single  Caucasian  female   No obstetric history on file. with Patient's last menstrual period was 01/31/2020.   here for f/u consult and repeat fasting labs. Per patient, prescription was never received from the pharmacy from Dermatologist for Spironolactone.      She was referred here by her dermatologist for evaluation of PCOS.  Her labs testing from 12/31/19 supports this diagnosis.  Her testosterone is elevated and so is her LH/FSH ratio. Her prolactin was also elevated and will be repeated today.  She is most concerned about increased hair growth.  She took birth control pills in the past.   She states she gained weight.   A1C is 5.7. T chol 242 with LDH 173.   Her cycles are irregular.  Cramping is not significant.   She states no chance of pregnancy.   GYNECOLOGIC HISTORY: Patient's last menstrual period was 01/31/2020. Contraception: Condoms every time. Menopausal hormone therapy:  n/a Last mammogram:  n/a Last pap smear: 2017 per patient        OB History   No obstetric history on file.        Patient Active Problem List   Diagnosis Date Noted  . Primary nocturnal enuresis 05/07/2012  . Obesity   . Hyperlipidemia   . Hirsutism   . Oligomenorrhea     Past Medical History:  Diagnosis Date  . Bipolar disorder (HCC)   . Elevated hemoglobin A1c 2021  . Elevated prolactin level 2021   needs fasting prolactin  . Heart murmur    as young child and has been told is resolved  . Hidradenitis suppurativa   . Hirsutism   . Hyperlipidemia   . Obesity   . Oligomenorrhea   . Primary nocturnal enuresis 05/07/2012  . 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 No pertinent surgical history.  Current Outpatient Medications  Medication Sig Dispense Refill  . chlorhexidine (HIBICLENS) 4 % external liquid Scrub Chlorhexidine Gluconate 4 % topical liquid  Use in the shower twice a week and after  shaving/waxing    . fluticasone (FLONASE) 50 MCG/ACT nasal spray fluticasone propionate 50 mcg/actuation nasal spray,suspension  SPRAY 1 SPRAY INTO EACH NOSTRIL TWICE A DAY    . clindamycin (CLEOCIN T) 1 % lotion clindamycin 1 % lotion  APPLY A THIN LAYER TO THE AFFECTED AREA(S) BY TOPICAL ROUTE 2 TIMES PER DAY (Patient not taking: Reported on 02/09/2020)    . clindamycin (CLEOCIN) 300 MG capsule clindamycin HCl 300 mg capsule  TAKE 1 CAPSULE EVERY 6 HOURS BY ORAL ROUTE FOR 10 DAYS. (Patient not taking: Reported on 02/09/2020)    . mupirocin ointment (BACTROBAN) 2 % mupirocin 2 % topical ointment  APPLY TO AFFECTED AREA 3 TIMES A DAY (Patient not taking: Reported on 02/09/2020)     No current facility-administered medications for this visit.     ALLERGIES: Amoxicillin, Hctz [hydrochlorothiazide], Penicillins, Ppd [tuberculin purified protein derivative], and Latex  Family History  Problem Relation Age of Onset  . Obesity Mother   . Diabetes Father   . Hypertension Father   . Diabetes Paternal Uncle   . Hypertension Paternal Uncle   . Obesity Paternal Uncle   . Cancer Paternal Grandfather        Dec from colon cancer    Social History   Socioeconomic History  . Marital status: Single    Spouse name: Not on file  .  Number of children: Not on file  . Years of education: Not on file  . Highest education level: Not on file  Occupational History  . Not on file  Tobacco Use  . Smoking status: Former Smoker    Quit date: 05/17/2014    Years since quitting: 5.7  . Smokeless tobacco: Never Used  Vaping Use  . Vaping Use: Some days  . Substances: Nicotine, Mixture of cannabinoids  Substance and Sexual Activity  . Alcohol use: No  . Drug use: Yes    Frequency: 7.0 times per week    Types: Marijuana    Comment: vapes marijuana daily  . Sexual activity: Yes    Birth control/protection: Condom    Comment: condoms everytime  Other Topics Concern  . Not on file  Social History  Narrative   Is in 11th grade at [employer]. Lives with parents and brother. Dance 2 days a week. Plays XBox.    Social Determinants of Health   Financial Resource Strain:   . Difficulty of Paying Living Expenses: Not on file  Food Insecurity:   . Worried About Programme researcher, broadcasting/film/video in the Last Year: Not on file  . Ran Out of Food in the Last Year: Not on file  Transportation Needs:   . Lack of Transportation (Medical): Not on file  . Lack of Transportation (Non-Medical): Not on file  Physical Activity:   . Days of Exercise per Week: Not on file  . Minutes of Exercise per Session: Not on file  Stress:   . Feeling of Stress : Not on file  Social Connections:   . Frequency of Communication with Friends and Family: Not on file  . Frequency of Social Gatherings with Friends and Family: Not on file  . Attends Religious Services: Not on file  . Active Member of Clubs or Organizations: Not on file  . Attends Banker Meetings: Not on file  . Marital Status: Not on file  Intimate Partner Violence:   . Fear of Current or Ex-Partner: Not on file  . Emotionally Abused: Not on file  . Physically Abused: Not on file  . Sexually Abused: Not on file    Review of Systems  Constitutional: Negative.   HENT: Negative.   Eyes: Negative.   Respiratory: Negative.   Cardiovascular: Negative.   Gastrointestinal: Negative.   Endocrine: Negative.   Genitourinary: Negative.   Musculoskeletal: Negative.   Skin: Negative.   Allergic/Immunologic: Negative.   Neurological: Negative.   Hematological: Negative.   Psychiatric/Behavioral: Negative.     PHYSICAL EXAMINATION:    BP 110/70 (BP Location: Left Arm, Patient Position: Sitting, Cuff Size: Large)   Pulse 80   Resp 18   Ht 5' 5.5" (1.664 m)   Wt 282 lb (127.9 kg)   LMP 01/31/2020   BMI 46.21 kg/m     General appearance: alert, cooperative and appears stated age   Pelvic: External genitalia:  no lesions               Urethra:  normal appearing urethra with no masses, tenderness or lesions              Bartholins and Skenes: normal                 Vagina: normal appearing vagina with normal color and discharge, no lesions              Cervix: no lesions  Bimanual Exam:   Declined.  Chaperone was present for exam.  ASSESSMENT  Cervical cancer screening.  STD screening.  Elevated prolactin.  Elevated A1C  PLAN  We discussed PCOS effects on menstrual cycle/increased risk of hyperplasia/effect on cardiovascular system due to elevated blood sugar and cholesterol/physical changes of increased hair growth and potential acne. Fasting prolactin, STD screening done today. Pap today. Yasmin.  Instructed in used.  I discussed potential risks of stroke, MI, DVT, and PE ant the warning signs associated with these.  Low carb diet.  She will call the dermatology office about the spironolactone.  She understands she will need to have her K checked yearly.  Fu in 3 months for a recheck.  30 min  total time was spent for this patient encounter, including preparation, face-to-face counseling with the patient, coordination of care, and documentation of the encounter.

## 2020-02-10 LAB — HEPATITIS C ANTIBODY: Hep C Virus Ab: <0.1 s/co ratio (ref 0.0–0.9)

## 2020-02-10 LAB — HEP, RPR, HIV PANEL
HIV Screen 4th Generation wRfx: NONREACTIVE
Hepatitis B Surface Ag: NEGATIVE
RPR Ser Ql: NONREACTIVE

## 2020-02-10 LAB — PROLACTIN: Prolactin: 12.3 ng/mL (ref 4.8–23.3)

## 2020-02-14 LAB — CYTOLOGY - PAP
Chlamydia: NEGATIVE
Comment: NEGATIVE
Comment: NEGATIVE
Comment: NORMAL
Diagnosis: NEGATIVE
Neisseria Gonorrhea: NEGATIVE
Trichomonas: NEGATIVE

## 2020-05-11 ENCOUNTER — Ambulatory Visit: Payer: BC Managed Care – PPO | Admitting: Obstetrics and Gynecology
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
## 2020-06-26 NOTE — Progress Notes (Deleted)
GYNECOLOGY  VISIT   HPI: 25 y.o.   Single  Caucasian  female   No obstetric history on file. with No LMP recorded. (Menstrual status: Irregular Periods).   here for 3 month medication follow up.    GYNECOLOGIC HISTORY: No LMP recorded. (Menstrual status: Irregular Periods). Contraception:  ***Yasmin Menopausal hormone therapy:  n/a Last mammogram:  n/a Last pap smear:   02-09-20 Neg        OB History   No obstetric history on file.        Patient Active Problem List   Diagnosis Date Noted  . Primary nocturnal enuresis 05/07/2012  . Obesity   . Hyperlipidemia   . Hirsutism   . Oligomenorrhea     Past Medical History:  Diagnosis Date  . Bipolar disorder (HCC)   . Elevated hemoglobin A1c 2021  . Elevated prolactin level 2021   needs fasting prolactin  . Heart murmur    as young child and has been told is resolved  . Hidradenitis suppurativa   . Hirsutism   . Hyperlipidemia   . Obesity   . Oligomenorrhea   . Primary nocturnal enuresis 05/07/2012  . 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  Current Outpatient Medications  Medication Sig Dispense Refill  . chlorhexidine (HIBICLENS) 4 % external liquid Scrub Chlorhexidine Gluconate 4 % topical liquid  Use in the shower twice a week and after shaving/waxing    . clindamycin (CLEOCIN T) 1 % lotion clindamycin 1 % lotion  APPLY A THIN LAYER TO THE AFFECTED AREA(S) BY TOPICAL ROUTE 2 TIMES PER DAY (Patient not taking: Reported on 02/09/2020)    . clindamycin (CLEOCIN) 300 MG capsule clindamycin HCl 300 mg capsule  TAKE 1 CAPSULE EVERY 6 HOURS BY ORAL ROUTE FOR 10 DAYS. (Patient not taking: Reported on 02/09/2020)    . drospirenone-ethinyl estradiol (YASMIN) 3-0.03 MG tablet Take 1 tablet by mouth daily. 28 tablet 11  . fluticasone (FLONASE) 50 MCG/ACT nasal spray fluticasone propionate 50 mcg/actuation nasal spray,suspension  SPRAY 1 SPRAY INTO EACH NOSTRIL TWICE A DAY    .  mupirocin ointment (BACTROBAN) 2 % mupirocin 2 % topical ointment  APPLY TO AFFECTED AREA 3 TIMES A DAY (Patient not taking: Reported on 02/09/2020)     No current facility-administered medications for this visit.     ALLERGIES: Amoxicillin, Hctz [hydrochlorothiazide], Penicillins, Ppd [tuberculin purified protein derivative], and Latex  Family History  Problem Relation Age of Onset  . Obesity Mother   . Diabetes Father   . Hypertension Father   . Diabetes Paternal Uncle   . Hypertension Paternal Uncle   . Obesity Paternal Uncle   . Cancer Paternal Grandfather        Dec from colon cancer    Social History   Socioeconomic History  . Marital status: Single    Spouse name: Not on file  . Number of children: Not on file  . Years of education: Not on file  . Highest education level: Not on file  Occupational History  . Not on file  Tobacco Use  . Smoking status: Former Smoker    Quit date: 05/17/2014    Years since quitting: 6.1  . Smokeless tobacco: Never Used  Vaping Use  . Vaping Use: Some days  . Substances: Nicotine, Mixture of cannabinoids  Substance and Sexual Activity  . Alcohol use: No  . Drug use: Yes    Frequency: 7.0  times per week    Types: Marijuana    Comment: vapes marijuana daily  . Sexual activity: Yes    Birth control/protection: Condom    Comment: condoms everytime  Other Topics Concern  . Not on file  Social History Narrative   Is in 11th grade at [employer]. Lives with parents and brother. Dance 2 days a week. Plays XBox.    Social Determinants of Health   Financial Resource Strain: Not on file  Food Insecurity: Not on file  Transportation Needs: Not on file  Physical Activity: Not on file  Stress: Not on file  Social Connections: Not on file  Intimate Partner Violence: Not on file    Review of Systems  PHYSICAL EXAMINATION:    There were no vitals taken for this visit.    General appearance: alert, cooperative and appears stated  age Head: Normocephalic, without obvious abnormality, atraumatic Neck: no adenopathy, supple, symmetrical, trachea midline and thyroid normal to inspection and palpation Lungs: clear to auscultation bilaterally Breasts: normal appearance, no masses or tenderness, No nipple retraction or dimpling, No nipple discharge or bleeding, No axillary or supraclavicular adenopathy Heart: regular rate and rhythm Abdomen: soft, non-tender, no masses,  no organomegaly Extremities: extremities normal, atraumatic, no cyanosis or edema Skin: Skin color, texture, turgor normal. No rashes or lesions Lymph nodes: Cervical, supraclavicular, and axillary nodes normal. No abnormal inguinal nodes palpated Neurologic: Grossly normal  Pelvic: External genitalia:  no lesions              Urethra:  normal appearing urethra with no masses, tenderness or lesions              Bartholins and Skenes: normal                 Vagina: normal appearing vagina with normal color and discharge, no lesions              Cervix: no lesions                Bimanual Exam:  Uterus:  normal size, contour, position, consistency, mobility, non-tender              Adnexa: no mass, fullness, tenderness              Rectal exam: {yes no:[id number]}.  Confirms.              Anus:  normal sphincter tone, no lesions  Chaperone was present for exam.  ASSESSMENT     PLAN     An After Visit Summary was printed and given to the patient.  ______ minutes face to face time of which over 50% was spent in counseling.

## 2020-06-28 ENCOUNTER — Ambulatory Visit: Payer: BC Managed Care – PPO | Admitting: Obstetrics and Gynecology

## 2021-05-25 ENCOUNTER — Emergency Department (HOSPITAL_COMMUNITY)
Admission: EM | Admit: 2021-05-25 | Discharge: 2021-05-25 | Disposition: A | Discharge status: Home / Self Care | Payer: PRIVATE HEALTH INSURANCE | Attending: Emergency Medicine | Admitting: Emergency Medicine

## 2021-05-25 ENCOUNTER — Ambulatory Visit
Admission: EM | Admit: 2021-05-25 | Discharge: 2021-05-25 | Disposition: A | Discharge status: Home / Self Care | Payer: PRIVATE HEALTH INSURANCE

## 2021-05-25 ENCOUNTER — Encounter (HOSPITAL_COMMUNITY): Payer: Self-pay

## 2021-05-25 ENCOUNTER — Other Ambulatory Visit: Payer: Self-pay

## 2021-05-25 DIAGNOSIS — N760 Acute vaginitis: Secondary | ICD-10-CM | POA: Diagnosis not present

## 2021-05-25 DIAGNOSIS — Z91040 Latex allergy status: Secondary | ICD-10-CM | POA: Insufficient documentation

## 2021-05-25 DIAGNOSIS — N764 Abscess of vulva: Secondary | ICD-10-CM | POA: Diagnosis not present

## 2021-05-25 DIAGNOSIS — R102 Pelvic and perineal pain: Secondary | ICD-10-CM | POA: Diagnosis present

## 2021-05-25 MED ORDER — SULFAMETHOXAZOLE-TRIMETHOPRIM 800-160 MG PO TABS: 1.0000 | ORAL_TABLET | Freq: Two times a day (BID) | ORAL | 0 refills | Status: AC

## 2021-05-25 NOTE — Discharge Instructions (Addendum)
You are seen in the ER today for your labial swelling.  You do have a small abscess starting.  You chose not to proceed with incision and drainage today.  You have been prescribed antibiotics to take twice a day for the next week.  Please take them as prescribed for the entire course.  Please call the OB/GYN office listed below to schedule follow-up appointment for your symptoms and return to the ER if you develop any nausea or vomiting that does not stop, fevers, chills, worsening of your pain or swelling, or any other new severe symptom. ?

## 2021-05-25 NOTE — ED Triage Notes (Signed)
Pt c/o of abscess on her labia X3 days. Pt sent here by UC. Pt has tried warm compress with no relief.  ? ?Pt prefers female provider  ? ?A/ox4 ?

## 2021-05-25 NOTE — ED Provider Notes (Signed)
?[facility] ? ? ? ?CSN: [id number] ?Arrival date & time: 05/25/21  1044 ? ? ?  ? ?History   ?Chief Complaint ?Chief Complaint  ?Patient presents with  ? Abscess  ? ? ?HPI ?Christy Ford is a 26 y.o. female.  ? ?Patient presents with an abscess to her vaginal area that has been present for a few days.  Denies any drainage from the area.  Denies fevers, body aches, chills.  Patient has been using warm soaks with minimal improvement. ? ? ?Abscess ? ?Past Medical History:  ?Diagnosis Date  ? Bipolar disorder (HCC)   ? Elevated hemoglobin A1c 2021  ? Elevated prolactin level 2021  ? needs fasting prolactin  ? Heart murmur   ? as young child and has been told is resolved  ? Hidradenitis suppurativa   ? Hirsutism   ? Hyperlipidemia   ? Obesity   ? Oligomenorrhea   ? Primary nocturnal enuresis 05/07/2012  ? Substance abuse (HCC) 2016  ? was addicted to meth  ? ? ?Patient Active Problem List  ? Diagnosis Date Noted  ? Primary nocturnal enuresis 05/07/2012  ? Obesity   ? Hyperlipidemia   ? Hirsutism   ? Oligomenorrhea   ? ? ?History reviewed. No pertinent surgical history. ? ?OB History   ?No obstetric history on file. ?  ? ? ? ?Home Medications   ? ?Prior to Admission medications   ?Medication Sig Start Date End Date Taking? Authorizing Provider  ?chlorhexidine (HIBICLENS) 4 % external liquid Scrub Chlorhexidine Gluconate 4 % topical liquid ? Use in the shower twice a week and after shaving/waxing    [provider]  ?clindamycin (CLEOCIN T) 1 % lotion clindamycin 1 % lotion ? APPLY A THIN LAYER TO THE AFFECTED AREA(S) BY TOPICAL ROUTE 2 TIMES PER DAY ?Patient not taking: Reported on 02/09/2020    [provider]  ?clindamycin (CLEOCIN) 300 MG capsule clindamycin HCl 300 mg capsule ? TAKE 1 CAPSULE EVERY 6 HOURS BY ORAL ROUTE FOR 10 DAYS. ?Patient not taking: Reported on 02/09/2020    [provider]  ?drospirenone-ethinyl estradiol (YASMIN) 3-0.03 MG tablet Take 1 tablet by mouth  daily. 02/09/20   Patton SallesAmundson C Silva, Brook E, MD  ?fluticasone (FLONASE) 50 MCG/ACT nasal spray fluticasone propionate 50 mcg/actuation nasal spray,suspension ? SPRAY 1 SPRAY INTO EACH NOSTRIL TWICE A DAY    [provider]  ?mupirocin ointment (BACTROBAN) 2 % mupirocin 2 % topical ointment ? APPLY TO AFFECTED AREA 3 TIMES A DAY ?Patient not taking: Reported on 02/09/2020    [provider]  ? ? ?Family History ?Family History  ?Problem Relation Age of Onset  ? Obesity Mother   ? Diabetes Father   ? Hypertension Father   ? Diabetes Paternal Uncle   ? Hypertension Paternal Uncle   ? Obesity Paternal Uncle   ? Cancer Paternal Grandfather   ?     Dec from colon cancer  ? ? ?Social History ?Social History  ? ?Tobacco Use  ? Smoking status: Former  ?  Types: Cigarettes  ?  Quit date: 05/17/2014  ?  Years since quitting: 7.0  ? Smokeless tobacco: Never  ?Vaping Use  ? Vaping Use: Some days  ? Substances: Nicotine, Mixture of cannabinoids  ?Substance Use Topics  ? Alcohol use: No  ? Drug use: Yes  ?  Frequency: 7.0 times per week  ?  Types: Marijuana  ?  Comment: vapes marijuana daily  ? ? ? ?Allergies   ?  Amoxicillin, Hctz [hydrochlorothiazide], Penicillins, Ppd [tuberculin purified protein derivative], and Latex ? ? ?Review of Systems ?Review of Systems ?Per HPI ? ?Physical Exam ?Triage Vital Signs ?ED Triage Vitals  ?Enc Vitals Group  ?   BP 05/25/21 1137 (!) 142/80  ?   Pulse Rate 05/25/21 1137 67  ?   Resp 05/25/21 1137 17  ?   Temp 05/25/21 1137 98.2 ?F (36.8 ?C)  ?   Temp Source 05/25/21 1137 Oral  ?   SpO2 05/25/21 1137 97 %  ?   Weight --   ?   Height --   ?   Head Circumference --   ?   Peak Flow --   ?   Pain Score 05/25/21 1136 8  ?   Pain Loc --   ?   Pain Edu? --   ?   Excl. in GC? --   ? ?No data found. ? ?Updated Vital Signs ?BP (!) 142/80 (BP Location: Left Arm)   Pulse 67   Temp 98.2 ?F (36.8 ?C) (Oral)   Resp 17   LMP 05/19/2021 (Exact Date)   SpO2 97%  ? ?Visual Acuity ?Right Eye  Distance:   ?Left Eye Distance:   ?Bilateral Distance:   ? ?Right Eye Near:   ?Left Eye Near:    ?Bilateral Near:    ? ?Physical Exam ?Exam conducted with a chaperone present.  ?Constitutional:   ?   General: She is not in acute distress. ?   Appearance: Normal appearance. She is not toxic-appearing or diaphoretic.  ?HENT:  ?   Head: Normocephalic and atraumatic.  ?Eyes:  ?   Extraocular Movements: Extraocular movements intact.  ?   Conjunctiva/sclera: Conjunctivae normal.  ?Pulmonary:  ?   Effort: Pulmonary effort is normal.  ?Genitourinary: ?   Comments: Patient has abscess located to right inner labia area.  Drainage noted. ?Neurological:  ?   General: No focal deficit present.  ?   Mental Status: She is alert and oriented to person, place, and time. Mental status is at baseline.  ?Psychiatric:     ?   Mood and Affect: Mood normal.     ?   Behavior: Behavior normal.     ?   Thought Content: Thought content normal.     ?   Judgment: Judgment normal.  ? ? ? ?UC Treatments / Results  ?Labs ?(all labs ordered are listed, but only abnormal results are displayed) ?Labs Reviewed - No data to display ? ?EKG ? ? ?Radiology ?No results found. ? ?Procedures ?Procedures (including critical care time) ? ?Medications Ordered in UC ?Medications - No data to display ? ?Initial Impression / Assessment and Plan / UC Course  ?I have reviewed the triage vital signs and the nursing notes. ? ?Pertinent labs & imaging results that were available during my care of the patient were reviewed by me and considered in my medical decision making (see chart for details). ? ?  ? ?Abscess appears consistent with Bartholin's gland abscess/cyst.  Advised patient that she will need to go to the hospital for further evaluation and management.  Patient was agreeable with plan.  Vital signs stable at discharge.  Agree with patient self transport to the hospital. ?Final Clinical Impressions(s) / UC Diagnoses  ? ?Final diagnoses:  ?Abscess, vagina   ? ? ? ?Discharge Instructions   ? ?  ?Please go to the emergency department as soon as you leave urgent care for further evaluation and management. ? ? ? ?  ED Prescriptions   ?None ?  ? ?PDMP not reviewed this encounter. ?  ?Gustavus Bryant, [place] ?05/25/21 1247 ? ?

## 2021-05-25 NOTE — ED Triage Notes (Signed)
Pt presents with c/o an abscess on her labia.  ? ?Pt states she wants antibiotics prescribed and does not want it drained.  ?

## 2021-05-25 NOTE — Discharge Instructions (Signed)
Please go to the emergency department as soon as you leave urgent care for further evaluation and management. ?

## 2021-05-25 NOTE — ED Provider Notes (Signed)
?[facility]-EMERGENCY DEPT ?Provider Note ? ? ?CSN: [id number] ?Arrival date & time: 05/25/21  1307 ? ?  ? ?History ? ?No chief complaint on file. ? ? ?Christy Ford is a 26 y.o. female with history of hidradenitis suppurativa who presents with concern for 3 days of right labial swelling and pain.  No improvement with warm compresses or warm baths.  Recently just got insurance again and therefore does not have establish care with OB/GYN or primary care doctor.  Does not tolerate penicillins but has been on doxycycline and clindamycin multiple times. ? ?Was seen in urgent care today and directed in the emergency department.  She denies any fevers, chills, nausea, vomiting, diarrhea.  Denies any vaginal bleeding.  Period ended approximately 3 days ago. ? ?She denies any possibility of pregnancy.  I personally reviewed her medical records.  She has history of heart murmur, bipolar disorder, history of methamphetamine addiction. ? ?HPI ? ?  ? ?Home Medications ?Prior to Admission medications   ?Medication Sig Start Date End Date Taking? Authorizing Provider  ?sulfamethoxazole-trimethoprim (BACTRIM DS) 800-160 MG tablet Take 1 tablet by mouth 2 (two) times daily for 7 days. 05/25/21 06/01/21 Yes Yorel Redder, Eugene Gavia, PA-C  ?chlorhexidine (HIBICLENS) 4 % external liquid Scrub Chlorhexidine Gluconate 4 % topical liquid ? Use in the shower twice a week and after shaving/waxing    [provider]  ?clindamycin (CLEOCIN T) 1 % lotion clindamycin 1 % lotion ? APPLY A THIN LAYER TO THE AFFECTED AREA(S) BY TOPICAL ROUTE 2 TIMES PER DAY ?Patient not taking: Reported on 02/09/2020    [provider]  ?clindamycin (CLEOCIN) 300 MG capsule clindamycin HCl 300 mg capsule ? TAKE 1 CAPSULE EVERY 6 HOURS BY ORAL ROUTE FOR 10 DAYS. ?Patient not taking: Reported on 02/09/2020    [provider]  ?drospirenone-ethinyl estradiol (YASMIN) 3-0.03 MG tablet Take 1 tablet by mouth daily. 02/09/20    Patton Salles, MD  ?fluticasone (FLONASE) 50 MCG/ACT nasal spray fluticasone propionate 50 mcg/actuation nasal spray,suspension ? SPRAY 1 SPRAY INTO EACH NOSTRIL TWICE A DAY    [provider]  ?mupirocin ointment (BACTROBAN) 2 % mupirocin 2 % topical ointment ? APPLY TO AFFECTED AREA 3 TIMES A DAY ?Patient not taking: Reported on 02/09/2020    [provider]  ?   ? ?Allergies    ?Amoxicillin, Hctz [hydrochlorothiazide], Penicillins, Ppd [tuberculin purified protein derivative], and Latex   ? ?Review of Systems   ?Review of Systems  ?Constitutional: Negative.   ?HENT: Negative.    ?Eyes: Negative.   ?Respiratory: Negative.    ?Cardiovascular: Negative.   ?Gastrointestinal: Negative.   ?Endocrine: Negative.   ?Genitourinary:  Positive for vaginal pain.  ?Musculoskeletal: Negative.   ?Skin: Negative.   ?Neurological: Negative.   ? ?Physical Exam ?Updated Vital Signs ?BP (!) 177/95   Pulse 80   Temp 97.7 ?F (36.5 ?C) (Oral)   Resp 18   LMP 05/19/2021 (Exact Date)   SpO2 97%  ?Physical Exam ?Vitals and nursing note reviewed. Exam conducted with a chaperone present.  ?Constitutional:   ?   Appearance: She is not ill-appearing or toxic-appearing.  ?HENT:  ?   Head: Normocephalic and atraumatic.  ?   Nose: Nose normal.  ?   Mouth/Throat:  ?   Mouth: Mucous membranes are moist.  ?   Pharynx: No oropharyngeal exudate or posterior oropharyngeal erythema.  ?Eyes:  ?   General:     ?  Right eye: No discharge.     ?   Left eye: No discharge.  ?   Extraocular Movements: Extraocular movements intact.  ?   Conjunctiva/sclera: Conjunctivae normal.  ?   Pupils: Pupils are equal, round, and reactive to light.  ?Cardiovascular:  ?   Rate and Rhythm: Normal rate and regular rhythm.  ?   Pulses: Normal pulses.  ?   Heart sounds: Normal heart sounds. No murmur heard. ?Pulmonary:  ?   Effort: Pulmonary effort is normal. No respiratory distress.  ?   Breath sounds: Normal breath sounds. No wheezing or  rales.  ?Abdominal:  ?   General: Bowel sounds are normal. There is no distension.  ?   Palpations: Abdomen is soft.  ?   Tenderness: There is no abdominal tenderness. There is no right CVA tenderness, left CVA tenderness, guarding or rebound.  ?Genitourinary: ?   Exam position: Supine.  ? ? ?   Comments: No internal exam performed.  ?Musculoskeletal:     ?   General: No deformity.  ?   Cervical back: Neck supple. No tenderness.  ?   Right lower leg: No edema.  ?   Left lower leg: No edema.  ?Lymphadenopathy:  ?   Cervical: No cervical adenopathy.  ?Skin: ?   General: Skin is warm and dry.  ?   Capillary Refill: Capillary refill takes less than 2 seconds.  ?   Findings: Abscess present.  ?Neurological:  ?   General: No focal deficit present.  ?   Mental Status: She is alert and oriented to person, place, and time. Mental status is at baseline.  ?Psychiatric:     ?   Mood and Affect: Mood normal.  ? ? ?ED Results / Procedures / Treatments   ?Labs ?(all labs ordered are listed, but only abnormal results are displayed) ?Labs Reviewed - No data to display ? ?EKG ?None ? ?Radiology ?No results found. ? ?Procedures ?Procedures  ? ? ?Medications Ordered in ED ?Medications - No data to display ? ?ED Course/ Medical Decision Making/ A&P ?  ?                        ?Medical Decision Making ?26 year old female with history of HS who presents with concern for right labial swelling and pain x 3 days.  ? ?Hypertensive on intake, VS otherwise normal. Cardiopulmonary exam is normal, abdominal exam is benign. GU exam as above, concerning for labial abscess.  ? ?I&D offered, patient adamantly declined. She prefers to start with oral antibiotic therapy first and follow up if necessary for I&D at a later date. I feel this is reasonable. Will discharge with prescription for PO antibiotics as well as OBGYN follow up.  ? ?Risk ?Prescription drug management. ? ? ?Desire voiced understanding of her medical evaluation and treatment plan. Each  of her questions was answered to her expressed satisfaction. Return precautions were given. Patient is well-appearing, stable, and discharged in good condition.  ? ?This chart was dictated using voice recognition software, Dragon. Despite the best efforts of this provider to proofread and correct errors, errors may still occur which can change documentation meaning. ? ? ?Final Clinical Impression(s) / ED Diagnoses ?Final diagnoses:  ?Labial abscess  ? ? ?Rx / DC Orders ?ED Discharge Orders   ? ?      Ordered  ?  sulfamethoxazole-trimethoprim (BACTRIM DS) 800-160 MG tablet  2 times daily       ?  05/25/21 1513  ? ?  ?  ? ?  ? ? ?  ?Paris LoreSponseller, Kristyl Athens R, PA-C ?05/25/21 1523 ? ?  ?Tegeler, Canary Brimhristopher J, MD ?05/25/21 1539 ? ?

## 2021-09-17 ENCOUNTER — Ambulatory Visit
Admission: EM | Admit: 2021-09-17 | Discharge: 2021-09-17 | Disposition: A | Discharge status: Home / Self Care | Payer: PRIVATE HEALTH INSURANCE | Attending: Internal Medicine | Admitting: Internal Medicine

## 2021-09-17 DIAGNOSIS — Z7689 Persons encountering health services in other specified circumstances: Secondary | ICD-10-CM

## 2021-09-17 NOTE — ED Provider Notes (Signed)
[facility]    CSN: [id number] Arrival date & time: 09/17/21  1207      History   Chief Complaint Chief Complaint  Patient presents with   work note    HPI Christy Ford is a 26 y.o. female comes to the urgent care requesting return to work evaluation.  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  Patient's symptoms have subsided.  She is requesting a return to work note.  No pain in the left shoulder. HPI  Past Medical History:  Diagnosis Date   Bipolar disorder (HCC)    Elevated hemoglobin A1c 2021   Elevated prolactin level 2021   needs fasting prolactin   Heart murmur    as young child and has been told is resolved   Hidradenitis suppurativa    Hirsutism    Hyperlipidemia    Obesity    Oligomenorrhea    Primary nocturnal enuresis 05/07/2012   Substance abuse (HCC) 2016   was addicted to meth    Patient Active Problem List   Diagnosis Date Noted   Primary nocturnal enuresis 05/07/2012   Obesity    Hyperlipidemia    Hirsutism    Oligomenorrhea     History reviewed. No pertinent surgical history.  OB History   No obstetric history on file.      Home Medications    Prior to Admission medications   Medication Sig Start Date End Date Taking? Authorizing Provider  chlorhexidine (HIBICLENS) 4 % external liquid Scrub Chlorhexidine Gluconate 4 % topical liquid  Use in the shower twice a week and after shaving/waxing    [provider]  clindamycin (CLEOCIN T) 1 % lotion clindamycin 1 % lotion  APPLY A THIN LAYER TO THE AFFECTED AREA(S) BY TOPICAL ROUTE 2 TIMES PER DAY Patient not taking: Reported on 02/09/2020    [provider]  clindamycin (CLEOCIN) 300 MG capsule clindamycin HCl 300 mg capsule  TAKE 1 CAPSULE EVERY 6 HOURS BY ORAL ROUTE FOR 10 DAYS. Patient not taking: Reported on 02/09/2020    [provider]  drospirenone-ethinyl estradiol (YASMIN) 3-0.03 MG tablet Take 1 tablet  by mouth daily. 02/09/20   Patton Salles, MD  fluticasone (FLONASE) 50 MCG/ACT nasal spray fluticasone propionate 50 mcg/actuation nasal spray,suspension  SPRAY 1 SPRAY INTO EACH NOSTRIL TWICE A DAY    [provider]  mupirocin ointment (BACTROBAN) 2 % mupirocin 2 % topical ointment  APPLY TO AFFECTED AREA 3 TIMES A DAY Patient not taking: Reported on 02/09/2020    [provider]    Family History Family History  Problem Relation Age of Onset   Obesity Mother    Diabetes Father    Hypertension Father    Diabetes Paternal Uncle    Hypertension Paternal Uncle    Obesity Paternal Uncle    Cancer Paternal Grandfather        Dec from colon cancer    Social History Social History   Tobacco Use   Smoking status: Former    Types: Cigarettes    Quit date: 05/17/2014    Years since quitting: 7.3   Smokeless tobacco: Never  Vaping Use   Vaping Use: Some days   Substances: Nicotine, Flavoring  Substance Use Topics   Alcohol use: No   Drug use: Yes    Frequency: 7.0 times per week    Types: Marijuana    Comment: vapes marijuana daily  Allergies   Amoxicillin, Hctz [hydrochlorothiazide], Penicillins, Ppd [tuberculin purified protein derivative], and Latex   Review of Systems Review of Systems  All other systems reviewed and are negative.    Physical Exam Triage Vital Signs ED Triage Vitals [09/17/21 1352]  Enc Vitals Group     BP 136/79     Pulse Rate 63     Resp 18     Temp 98 F (36.7 C)     Temp Source Oral     SpO2 97 %     Weight      Height      Head Circumference      Peak Flow      Pain Score      Pain Loc      Pain Edu?      Excl. in GC?    No data found.  Updated Vital Signs BP 136/79   Pulse 63   Temp 98 F (36.7 C) (Oral)   Resp 18   LMP 09/12/2021   SpO2 97%   Visual Acuity Right Eye Distance:   Left Eye Distance:   Bilateral Distance:    Right Eye Near:   Left Eye Near:    Bilateral Near:      Physical Exam Vitals and nursing note reviewed.  Constitutional:      Appearance: Normal appearance.  HENT:     Right Ear: Tympanic membrane normal.     Left Ear: Tympanic membrane normal.  Cardiovascular:     Rate and Rhythm: Normal rate and regular rhythm.     Pulses: Normal pulses.     Heart sounds: Normal heart sounds.  Musculoskeletal:        General: Normal range of motion.  Neurological:     Mental Status: She is alert.      UC Treatments / Results  Labs (all labs ordered are listed, but only abnormal results are displayed) Labs Reviewed - No data to display  EKG   Radiology No results found.  Procedures Procedures (including critical care time)  Medications Ordered in UC Medications - No data to display  Initial Impression / Assessment and Plan / UC Course  I have reviewed the triage vital signs and the nursing notes.  Pertinent labs & imaging results that were available during my care of the patient were reviewed by me and considered in my medical decision making (see chart for details).     1.  Return to work evaluation: Return to work note given. Return precautions given. Final Clinical Impressions(s) / UC Diagnoses   Final diagnoses:  Return to work evaluation     Discharge Instructions      Attached is your return to work note with no restrictions.   ED Prescriptions   None    PDMP not reviewed this encounter.   Merrilee Jansky, MD 09/17/21 [phone]

## 2021-09-17 NOTE — ED Triage Notes (Signed)
Patient presents to Urgent Care with complaints of recent shoulder injury at work and was not cleared by [employer] comp but is now not experiencing pain and has been working with pt. Pt reports she feels she is ready to go back to work and she needs to be medically cleared for her to be able to restart but the time frame has ended and workman's comp will not cover any more visits so she is here with her own insurance.

## 2021-09-17 NOTE — Discharge Instructions (Signed)
Attached is your return to work note with no restrictions.

## 2022-01-12 DIAGNOSIS — E860 Dehydration: Secondary | ICD-10-CM

## 2022-01-12 DIAGNOSIS — N179 Acute kidney failure, unspecified: Secondary | ICD-10-CM

## 2022-01-12 NOTE — ED Triage Notes (Signed)
Reports multiple episodes of vomiting every hour since 10:00 AM  today.  Also reports left sided abd/flank pain

## 2022-01-12 NOTE — ED Notes (Signed)
Pt presents to ED ambulatory complaining of Abdominal pain with N/V onset today . Pt is alert and oriented x 4, RR even and unlabored, skin  intact. Assessment completed and pt updated on plan of care.  Call bell in reach.       Emergency Department Nursing Plan of Care       The Nursing Plan of Care is developed from the Nursing assessment and Emergency Department Attending provider initial evaluation.  The plan of care may be reviewed in the "ED Provider note".    The Plan of Care was developed with the following considerations:   Patient / Family readiness to learn indicated [id number] understanding  Persons(s) to be included in education: patient  Barriers to Learning/Limitations:No    Signed         Cornell Barman, [place]  01/12/22 2322

## 2022-01-12 NOTE — ED Provider Notes (Signed)
EMERGENCY DEPARTMENT HISTORY AND PHYSICAL EXAM            Please note that this dictation was completed with the assistance of "Dragon", the computer voice recognition software. Quite often unanticipated grammatical, syntax, homophones, and other interpretive errors are inadvertently transcribed by the computer software. Please disregard these errors and any errors that have escaped final proofreading. Thank you.    Date of Evaluation: 01/13/22  Patient: Kelsey Ray  Patient Age and Sex: 26 y.o. female   MRN: [id number]  CSN: [id number]  PCP: No primary care provider on file.    History of Present Illness     Chief Complaint   Patient presents with    Emesis     History Provided By: Patient/family/EMS (if available)    History is limited by: Nothing     HPI: Kelsey Ray, 26 y.o. female with past medical history as documented below presents to the ED with c/o of 1 day history of moderate intensity, nonbloody, nonbilious emesis since 10 AM.  Patient notes having onset of symptoms around 10 AM, no preceding factors.  Reports pain in the left lower quadrant, describes pain as sharp in nature.  No prior surgical history.  Denies any chance of pregnancy.  She does admit to recent marijuana usage.  Upon arrival, patient actively vomiting.. Pt denies any other exacerbating or ameliorating factors. There are no other complaints, changes or physical findings pertinent to the HPI at this time.    Nursing notes were all reviewed and agreed with or any disagreements were addressed in the HPI.    Past History   Past Medical History:  No past medical history on file.    Past Surgical History:  No past surgical history on file.    Family History:   Family history reviewed and was non-contributory, unless specified below:  No family history on file.    Social History:       Allergies:  Allergies   Allergen Reactions    Latex Rash    Amoxicillin Rash    Pcn [Penicillins] Rash       Current Medications:  No current  facility-administered medications on file prior to encounter.     No current outpatient medications on file prior to encounter.       Review of Systems   A complete ROS was reviewed by me today and all other systems negative, unless otherwise specified below:  Review of Systems   Constitutional:  Negative for chills and fever.   HENT:  Negative for rhinorrhea and sore throat.    Eyes:  Negative for pain and visual disturbance.   Respiratory:  Negative for cough and shortness of breath.    Cardiovascular:  Negative for chest pain.   Gastrointestinal:  Positive for abdominal pain, nausea and vomiting. Negative for diarrhea.   Musculoskeletal:  Negative for arthralgias and myalgias.   Skin:  Negative for rash.   Neurological:  Negative for speech difficulty, light-headedness and headaches.   Psychiatric/Behavioral:  Negative for agitation and confusion.      Physical Exam     Vitals:    01/12/22 2135   BP: 129/77   Pulse: 64   Resp: 20   Temp: 98.8 F (37.1 C)   TempSrc: Oral   SpO2: 96%   Weight: 131.1 kg (289 lb)   Height: 1.676 m (_0 )       Physical Exam  Vitals and nursing note reviewed.   Constitutional:  Appearance: Normal appearance.   HENT:      Head: Normocephalic and atraumatic.      Nose: Nose normal.      Mouth/Throat:      Mouth: Mucous membranes are moist.   Eyes:      Extraocular Movements: Extraocular movements intact.      Conjunctiva/sclera: Conjunctivae normal.   Cardiovascular:      Rate and Rhythm: Normal rate and regular rhythm.   Pulmonary:      Effort: Pulmonary effort is normal. No respiratory distress.   Abdominal:      General: Abdomen is flat. There is no distension.      Tenderness: There is no abdominal tenderness.      Comments: Tender to palpation left lower quadrant, no rebound or guarding, no CVA tenderness.  No abdominal mass, no hernia appreciated.   Musculoskeletal:         General: Normal range of motion.      Cervical back: Normal range of motion.   Skin:     General:  Skin is warm.   Neurological:      General: No focal deficit present.      Mental Status: She is alert. Mental status is at baseline.   Psychiatric:         Mood and Affect: Mood normal.       Diagnostic Studies     LABORATORY RESULTS:  I have personally reviewed and interpreted all available laboratory results.   Recent Results (from the past 24 hour(s))   CBC with Auto Differential    Collection Time: 01/12/22 10:39 PM   Result Value Ref Range    WBC 16.4 (H) 3.6 - 11.0 K/uL    RBC 4.51 3.80 - 5.20 M/uL    Hemoglobin 13.1 11.5 - 16.0 g/dL    Hematocrit 39.1 35.0 - 47.0 %    MCV 86.7 80.0 - 99.0 FL    MCH 29.0 26.0 - 34.0 PG    MCHC 33.5 30.0 - 36.5 g/dL    RDW 13.8 11.5 - 14.5 %    Platelets 388 150 - 400 K/uL    MPV 9.9 8.9 - 12.9 FL    Nucleated RBCs 0.0 0 PER 100 WBC    nRBC 0.00 0.00 - 0.01 K/uL    Neutrophils % 90 (H) 32 - 75 %    Lymphocytes % 5 (L) 12 - 49 %    Monocytes % 4 (L) 5 - 13 %    Eosinophils % 0 0 - 7 %    Basophils % 0 0 - 1 %    Immature Granulocytes 1 (H) 0.0 - 0.5 %    Neutrophils Absolute 14.7 (H) 1.8 - 8.0 K/UL    Lymphocytes Absolute 0.8 0.8 - 3.5 K/UL    Monocytes Absolute 0.7 0.0 - 1.0 K/UL    Eosinophils Absolute 0.0 0.0 - 0.4 K/UL    Basophils Absolute 0.0 0.0 - 0.1 K/UL    Absolute Immature Granulocyte 0.2 (H) 0.00 - 0.04 K/UL    Differential Type SMEAR SCANNED      RBC Comment NORMOCYTIC, NORMOCHROMIC     Comprehensive Metabolic Panel    Collection Time: 01/12/22 10:39 PM   Result Value Ref Range    Sodium 142 136 - 145 mmol/L    Potassium 3.7 3.5 - 5.1 mmol/L    Chloride 104 97 - 108 mmol/L    CO2 23 21 - 32 mmol/L    Anion Gap 15 5 -  15 mmol/L    Glucose 148 (H) 65 - 100 mg/dL    BUN 13 6 - 20 MG/DL    Creatinine 1.32 (H) 0.55 - 1.02 MG/DL    Bun/Cre Ratio 10 (L) 12 - 20      Est, Glom Filt Rate 57 (L) >60 ml/min/1.35m    Calcium 9.5 8.5 - 10.1 MG/DL    Total Bilirubin 0.5 0.2 - 1.0 MG/DL    ALT 25 12 - 78 U/L    AST 25 15 - 37 U/L    Alk Phosphatase 62 45 - 117 U/L    Total  Protein 7.8 6.4 - 8.2 g/dL    Albumin 3.6 3.5 - 5.0 g/dL    Globulin 4.2 (H) 2.0 - 4.0 g/dL    Albumin/Globulin Ratio 0.9 (L) 1.1 - 2.2     Lipase    Collection Time: 01/12/22 10:39 PM   Result Value Ref Range    Lipase 13 13 - 75 U/L   HCG Qualitative, Serum    Collection Time: 01/12/22 10:39 PM   Result Value Ref Range    hCG Qual Negative NEG         RADIOLOGY RESULTS:  I have personally reviewed and interpreted all available imaging studies and agree with radiology interpretation.  No orders to display       No orders to display     MMontroseED COURSE   I am the first and primary ED physician for this patient's ED visit today.    I reviewed our EMR for any past records that may contribute to the patient's current condition, including their past medical, surgical, social and family history. This also includes their most recent ED visits, previous hospitalizations and prior diagnostic data. I have reviewed and summarized the most pertinent findings in my HPI and MDM.    Vital Signs Reviewed:  Patient Vitals for the past 24 hrs:   Temp Pulse Resp BP SpO2   01/12/22 2135 98.8 F (37.1 C) 64 20 129/77 96 %     Pulse Oximetry Analysis: 96% on RA with good pleth    Cardiac Monitor:   Rate: 64 bpm  The cardiac monitor revealed the following rhythm as interpreted by me: Normal Sinus Rhythm  Cardiac and pulse ox monitoring were ordered to monitor patient for signs of cardiac dysrhythmia, which they are at risk for based on their history and/or risk for cardiovascular disease and/or metabolic abnormalities.     Records Reviewed: Nursing Notes, Old Medical Records, Previous electrocardiograms, Previous Radiology Studies and Previous Laboratory Studies, EMS reports    DIFFERENTIAL DIAGNOSIS AND PLAN:  Pt presents with acute abdominal pain, nausea and vomiting; vital signs stable with currently a non-peritoneal exam; DDx includes: Gastroenteritis, hepatitis, pancreatitis, obstruction, appendicitis, viral  illness, IBD, diverticulitis, mesenteric ischemia, AAA or descending dissection, ACS, kidney stone. Will check labs including CBC to evaluate for infection/anemia, CMP to evaluate for electrolyte disturbances, renal/liver function, lipase to evaluate for pancreatitis; will obtain UA as indicated; will treat symptomatically including optimizing pain and anti-emetic control while in the ED; will also perform serial abdominal exams to determine if additional imaging is indicated. Will reassess and monitor closely. Dispo pending clinical course and workup.    Pertinent Chronic Medical Conditions Affecting Care:  No past medical history on file.    Review of Prior Records and External Documents:  See below in ED course    Social Determinants of Health with Concerns  Tobacco Use: Not on file   Alcohol Use: Not on file   Financial Resource Strain: Not on file   Food Insecurity: Not on file   Transportation Needs: Not on file   Physical Activity: Not on file   Stress: Not on file   Social Connections: Not on file   Intimate Partner Violence: Not on file   Depression: Not on file   Housing Stability: Not on file          ED Course: Progress Notes, Reevaluation, and Consults:  Initial assessment performed. I discussed presenting problems and concerns, and my formulated plan for today's visit with the patient and any available family members. I have encouraged them to ask questions as they arise throughout the visit.     ED Physician Orders:   Orders Placed This Encounter   Procedures    Urinalysis with Reflex to Culture    CBC with Auto Differential    Comprehensive Metabolic Panel    Lipase    HCG Qualitative, Serum    CARDIAC/RESPIRATORY MONITORING    POC Pregnancy Urine Qual    Insert peripheral IV     ED Medications Administered:   Medications   iopamidol (ISOVUE-370) 76 % injection 100 mL (has no administration in time range)   ondansetron (ZOFRAN-ODT) disintegrating tablet 4 mg (4 mg Oral Given 01/12/22 2205)    sodium chloride 0.9 % bolus 1,000 mL (0 mLs IntraVENous Stopped 01/13/22 0008)   droperidol (INAPSINE) injection 1.25 mg (1.25 mg IntraVENous Given 01/12/22 2259)   ketorolac (TORADOL) injection 30 mg (30 mg IntraVENous Given 01/12/22 2259)     Pt received IV/IM medications per above and placed on appropriate cardiac/respiratory monitoring due to drug toxicity.    ED Physician Interpretation of Test Results:  All results were independently reviewed and interpreted by myself, notably showing:     RADIOLOGY:  Non-plain film images such as CT, ultrasound and MRI are read by the radiologist. Plain radiographic images are visualized and preliminarily interpreted by the ED Provider with the below findings:     Imaging independently interpreted by me:     Interpretation per the Radiologist below, if available at the time of this note:  No orders to display       My interpretation of laboratory results:   See below in ED course    Amount and/or Complexity of Data Reviewed  HIGH complexity decision making performed   Presentation: ACUTE and SEVERE  Clinical lab tests: ordered as appropriate & reviewed  Tests in the radiology section of CPT: ordered as appropriate & reviewed  Tests in the medicine section of CPT: ordered as appropriate & reviewed  Review and summarize past medical records: yes  Independent visualization of images, tracings, or specimens: yes    Risks  OTC drugs.  Prescription drug management.  Parenteral controlled substances.  Drug therapy requiring intensive monitoring for toxicity.  Decision regarding hospitalization.     Is this patient to be included in the SEP-1 core measure? No Exclusion criteria - the patient is NOT to be included for SEP-1 Core Measure due to: Infection is not suspected    Progress Note:  I have just re-evaluated the patient. Pt reports improvement of her symptoms after ED treatment. I have reviewed her vital signs and determined there is currently no worsening in their condition  or physical exam. Results have been reviewed with them and their questions have been answered. I will continue to review further results as they come  available.     ED Course as of 01/13/22 0040   Sat Jan 12, 2022   2306 Patient being waiting to be roomed, ODT Zofran given with minimal effect.  IV established, given marijuana usage, plan for IV droperidol. [HW]   2321 Labs noted thus far, CBC with white count 16.4, hemoglobin is normal.  Negative pregnancy test. [HW]   Sun Jan 13, 2022   0005 Patient now states that she does not want the CT scan.  States that she is symptomatically improved after IV droperidol.  Shared decision making.  Patient plan for discharge home at this time. [HW]   0039 Creatinine(!): 1.32 [HW]   0039 WBC(!): 16.4 [HW]      ED Course User Index  [HW] Fredderick Erb, MD      CONSULTS:   None    CRITICAL CARE NOTE :  IMPENDING DETERIORATION -Cardiovascular, Metabolic, Renal, and Hepatic  ASSOCIATED RISK FACTORS - Hypotension, Dysrhythmia, Metabolic changes, Dehydration, and Vascular Compromise  MANAGEMENT- Bedside Assessment and Supervision of Care  INTERPRETATION -  Xrays, ECG, Blood Pressure, Cardiac Output Measures , and lab work  INTERVENTIONS - hemodynamic mngmt, vascular control, and Metobolic interventions  CASE REVIEW - Nursing and Family  TREATMENT RESPONSE -Improved  PERFORMED BY - Self    NOTES   :  I personally spent 35 minutes of critical care time with this patient. This is time spent at this critically ill patient's bedside actively involved in patient care as well as the coordination of care and discussions with the patient's family. This includes time involved in ordering and reviewing of laboratory studies, pulse oximetry, re-evaluation of patient's condition, examination of patient, evaluation of patient's response to treatment, ordering and performing treatments and interventions, review of old charts, consultations with specialist, discussions with family regarding pertinent  collateral history and plan of care, bedside attention and documentation. During this entire length of time I was immediately available to the patient. This does not include time spent on separately reported billable procedures.     Critical Care: The reason for providing this level of medical care for this critically-ill patient was due to a critical illness that impaired one or more vital organ systems, such that there was a high probability of imminent or life-threatening deterioration in the patient's condition. This care involved the highest level of preparedness to intervene urgently. This care involved high complexity decision making to assess, manipulate, and support vital system functions, to treat this degree of vital organ system failure, and to prevent further life threatening deterioration of the patient's condition requiring frequent assessments and interventions.    Fredderick Erb, MD    Shared Decision Making:   I considered CT imaging but patient refused as well as patient having a benign, nonfocal and nonsurgical abdominal exam.  Strict return precautions given.  Patient agreement with above plan.    I have discussed with the patient my clinical impression and the result of an evidence-based clinical evaluation to screen for an acute abdominal emergency, as well as the risk of further testing and hospitalization. The evidence shows that the risk for an acute condition related to today's symptoms and signs is extremely low. Although the risk of progression or new symptoms cannot be excluded, the risks of further testing or hospitalization likely exceed the benefit, and the patient declines further emergent evaluation or hospitalization for evaluation of the abdominal pain.    Progress Note:  I have re-examined the patient. Pt states she feels much better  and symptoms improved. Tolerating oral intake. Abdomen is soft and without guarding, rebound or other peritoneal signs. I have discussed with patient  the importance of close f/u and to return to the ED if symptoms don't improve or worsen.    DISCHARGE  Pt reassessed and symptoms noted to have improved significantly after ED treatment. Kelsey Ray's labs and imaging have been reviewed with her and available family. She verbally conveys understanding and agreement of the signs, symptoms, diagnosis, treatment and prognosis and additionally agrees to follow up as recommended with Dr. No primary care provider on file. and/or specialist as instructed. She agrees with the care plan we have created and conveys that all of her questions have been answered. Additionally, I have put together a packet of discharge instructions for her that include: 1) Educational information regarding their diagnosis, 2) How to care for their diagnosis at home, as well a 3) List of reasons why they would want to return to the ED prior to their follow-up appointment should their condition change or symptoms worsen.     I have answered all questions to the patient's satisfaction. Strict return precautions given. She and/or family conveyed understanding and agreement with care plan. Vital signs stable for discharge.     PLAN  1. Return precautions as discussed with patient and available family/caregiver.     2. DISCHARGE MEDICATIONS:     Medication List        START taking these medications      dicyclomine 20 MG tablet  Commonly known as: BENTYL  Take 1 tablet by mouth 4 times daily     ondansetron 4 MG tablet  Commonly known as: ZOFRAN  Take 1 tablet by mouth 3 times daily as needed for Nausea or Vomiting     * promethazine 25 MG tablet  Commonly known as: PHENERGAN  Take 1 tablet by mouth 4 times daily as needed for Nausea     * promethazine 25 MG suppository  Commonly known as: Promethegan  Place 1 suppository rectally every 6 hours as needed for Nausea           * This list has 2 medication(s) that are the same as other medications prescribed for you. Read the directions carefully, and  ask your doctor or other care provider to review them with you.                   Where to Get Your Medications        These medications [facility] sent [id number] [id number] [place] [address]     Phone: [phone]  dicyclomine 20 MG tablet  ondansetron 4 MG tablet  promethazine 25 MG suppository  promethazine 25 MG tablet           3. [facility]:  [facility]  [address] [phone]   As needed, If symptoms worsen                              CLINICAL IMPRESSION     1. Acute dehydration    2. Leukocytosis, unspecified type    3. AKI (acute kidney injury) ([facility]ouch    4. Cannabinoid hyperemesis syndrome      Attestation:  I am the attending of record for this patient. I  personally performed the services described in this documentation on this date, 01/12/2022 for patient, Kelsey Ray. I have reviewed the chart and verified that the record is accurate and complete.      Fredderick Erb, MD (Electronic Signature)         Fredderick Erb, MD  01/13/22 [phone]

## 2022-01-13 ENCOUNTER — Inpatient Hospital Stay
Admit: 2022-01-13 | Discharge: 2022-01-13 | Disposition: A | Discharge status: Home / Self Care | Attending: Emergency Medicine

## 2022-01-13 LAB — CBC WITH AUTO DIFFERENTIAL
Absolute Immature Granulocyte: 0.2 10*3/uL — ABNORMAL HIGH (ref 0.00–0.04)
Basophils %: 0 % (ref 0–1)
Basophils Absolute: 0 10*3/uL (ref 0.0–0.1)
Eosinophils %: 0 % (ref 0–7)
Eosinophils Absolute: 0 10*3/uL (ref 0.0–0.4)
Hematocrit: 39.1 % (ref 35.0–47.0)
Hemoglobin: 13.1 g/dL (ref 11.5–16.0)
Immature Granulocytes: 1 % — ABNORMAL HIGH (ref 0.0–0.5)
Lymphocytes %: 5 % — ABNORMAL LOW (ref 12–49)
Lymphocytes Absolute: 0.8 10*3/uL (ref 0.8–3.5)
MCH: 29 PG (ref 26.0–34.0)
MCHC: 33.5 g/dL (ref 30.0–36.5)
MCV: 86.7 FL (ref 80.0–99.0)
MPV: 9.9 FL (ref 8.9–12.9)
Monocytes %: 4 % — ABNORMAL LOW (ref 5–13)
Monocytes Absolute: 0.7 10*3/uL (ref 0.0–1.0)
Neutrophils %: 90 % — ABNORMAL HIGH (ref 32–75)
Neutrophils Absolute: 14.7 10*3/uL — ABNORMAL HIGH (ref 1.8–8.0)
Nucleated RBCs: 0 PER 100 WBC
Platelets: 388 10*3/uL (ref 150–400)
RBC: 4.51 M/uL (ref 3.80–5.20)
RDW: 13.8 % (ref 11.5–14.5)
WBC: 16.4 10*3/uL — ABNORMAL HIGH (ref 3.6–11.0)
nRBC: 0 10*3/uL (ref 0.00–0.01)

## 2022-01-13 LAB — COMPREHENSIVE METABOLIC PANEL
ALT: 25 U/L (ref 12–78)
AST: 25 U/L (ref 15–37)
Albumin/Globulin Ratio: 0.9 — ABNORMAL LOW (ref 1.1–2.2)
Albumin: 3.6 g/dL (ref 3.5–5.0)
Alk Phosphatase: 62 U/L (ref 45–117)
Anion Gap: 15 mmol/L (ref 5–15)
BUN: 13 MG/DL (ref 6–20)
Bun/Cre Ratio: 10 — ABNORMAL LOW (ref 12–20)
CO2: 23 mmol/L (ref 21–32)
Calcium: 9.5 MG/DL (ref 8.5–10.1)
Chloride: 104 mmol/L (ref 97–108)
Creatinine: 1.32 MG/DL — ABNORMAL HIGH (ref 0.55–1.02)
Est, Glom Filt Rate: 57 mL/min/{1.73_m2} — ABNORMAL LOW (ref >60)
Globulin: 4.2 g/dL — ABNORMAL HIGH (ref 2.0–4.0)
Glucose: 148 mg/dL — ABNORMAL HIGH (ref 65–100)
Potassium: 3.7 mmol/L (ref 3.5–5.1)
Sodium: 142 mmol/L (ref 136–145)
Total Bilirubin: 0.5 MG/DL (ref 0.2–1.0)
Total Protein: 7.8 g/dL (ref 6.4–8.2)

## 2022-01-13 LAB — LIPASE: Lipase: 13 U/L (ref 13–75)

## 2022-01-13 LAB — HCG, SERUM, QUALITATIVE: hCG Qual: NEGATIVE

## 2022-01-13 MED ORDER — ONDANSETRON 4 MG PO TBDP
4 MG | Freq: Once | ORAL | Status: AC
Start: 2022-01-13 — End: 2022-01-12
  Administered 2022-01-13: 02:00:00 4 mg via ORAL

## 2022-01-13 MED ORDER — ONDANSETRON HCL 4 MG PO TABS
4 MG | ORAL_TABLET | Freq: Three times a day (TID) | ORAL | 0 refills | Status: AC | PRN
Start: 2022-01-13 — End: ?

## 2022-01-13 MED ORDER — PROMETHAZINE HCL 25 MG PO TABS
25 MG | ORAL | Status: DC
Start: 2022-01-13 — End: 2022-01-12

## 2022-01-13 MED ORDER — SODIUM CHLORIDE 0.9 % IV BOLUS
0.9 % | Freq: Once | INTRAVENOUS | Status: AC
Start: 2022-01-13 — End: 2022-01-13
  Administered 2022-01-13: 03:00:00 1000 mL via INTRAVENOUS

## 2022-01-13 MED ORDER — KETOROLAC TROMETHAMINE 30 MG/ML IJ SOLN
30 MG/ML | INTRAMUSCULAR | Status: AC
Start: 2022-01-13 — End: 2022-01-12
  Administered 2022-01-13: 03:00:00 30 mg via INTRAVENOUS

## 2022-01-13 MED ORDER — DICYCLOMINE HCL 20 MG PO TABS
20 MG | ORAL_TABLET | Freq: Four times a day (QID) | ORAL | 0 refills | Status: AC
Start: 2022-01-13 — End: ?

## 2022-01-13 MED ORDER — IOPAMIDOL 76 % IV SOLN
76 % | Freq: Once | INTRAVENOUS | Status: DC | PRN
Start: 2022-01-13 — End: 2022-01-13

## 2022-01-13 MED ORDER — PROMETHAZINE HCL 25 MG PO TABS
25 MG | ORAL_TABLET | Freq: Four times a day (QID) | ORAL | 0 refills | Status: AC | PRN
Start: 2022-01-13 — End: 2022-01-20

## 2022-01-13 MED ORDER — PROMETHAZINE HCL 25 MG RE SUPP
25 MG | Freq: Four times a day (QID) | RECTAL | 0 refills | Status: AC | PRN
Start: 2022-01-13 — End: 2022-01-20

## 2022-01-13 MED ORDER — DROPERIDOL 2.5 MG/ML IJ SOLN
2.5 MG/ML | Freq: Once | INTRAMUSCULAR | Status: AC
Start: 2022-01-13 — End: 2022-01-12
  Administered 2022-01-13: 03:00:00 1.25 mg via INTRAVENOUS

## 2022-01-13 MED FILL — KETOROLAC TROMETHAMINE 30 MG/ML IJ SOLN: 30 MG/ML | INTRAMUSCULAR | Qty: 1

## 2022-01-13 MED FILL — ONDANSETRON 4 MG PO TBDP: 4 MG | ORAL | Qty: 1

## 2022-01-13 MED FILL — SODIUM CHLORIDE 0.9 % IV SOLN: 0.9 % | INTRAVENOUS | Qty: 1000

## 2022-01-13 MED FILL — DROPERIDOL 2.5 MG/ML IJ SOLN: 2.5 MG/ML | INTRAMUSCULAR | Qty: 2

## 2022-01-13 NOTE — ED Notes (Signed)
Discharge instructions were given to the patient by [facility].     The patient left the Emergency Department ambulatory, alert and oriented and in no acute distress with 3 prescriptions. The patient was encouraged to call or return to the ED for worsening issues or problems and was encouraged to schedule a follow up appointment for continuing care.     The patient verbalized understanding of discharge instructions and prescriptions, all questions were answered. The patient has no further concerns at this time.        Cornell Barman, RN  01/13/22 [phone]

## 2022-01-13 NOTE — Discharge Instructions (Signed)
Thank You!    It was a pleasure taking care of you in our Emergency Department today. We know that when you come to [facility], you are entrusting [place] with your health, comfort, and safety. Our physicians and nurses honor that trust, and truly appreciate the opportunity to care for you and your loved ones.      We also value your feedback. If you receive a survey about your Emergency Department experience today, please fill it out.  We care about our patients' feedback, and we listen to what you have to say. Thank you.    Dr. Fredderick Erb, M.D.      ____________________________________________________________________  I have included a copy of your lab results and/or radiologic studies from today's visit so you can have them easily available at your follow-up visit. We hope you feel better and please do not hesitate to contact the ED if you have any questions at all!    Recent Results (from the past 12 hour(s))   CBC with Auto Differential    Collection Time: 01/12/22 10:39 PM   Result Value Ref Range    WBC 16.4 (H) 3.6 - 11.0 K/uL    RBC 4.51 3.80 - 5.20 M/uL    Hemoglobin 13.1 11.5 - 16.0 g/dL    Hematocrit 39.1 35.0 - 47.0 %    MCV 86.7 80.0 - 99.0 FL    MCH 29.0 26.0 - 34.0 PG    MCHC 33.5 30.0 - 36.5 g/dL    RDW 13.8 11.5 - 14.5 %    Platelets 388 150 - 400 K/uL    MPV 9.9 8.9 - 12.9 FL    Nucleated RBCs 0.0 0 PER 100 WBC    nRBC 0.00 0.00 - 0.01 K/uL    Neutrophils % 90 (H) 32 - 75 %    Lymphocytes % 5 (L) 12 - 49 %    Monocytes % 4 (L) 5 - 13 %    Eosinophils % 0 0 - 7 %    Basophils % 0 0 - 1 %    Immature Granulocytes 1 (H) 0.0 - 0.5 %    Neutrophils Absolute 14.7 (H) 1.8 - 8.0 K/UL    Lymphocytes Absolute 0.8 0.8 - 3.5 K/UL    Monocytes Absolute 0.7 0.0 - 1.0 K/UL    Eosinophils Absolute 0.0 0.0 - 0.4 K/UL    Basophils Absolute 0.0 0.0 - 0.1 K/UL    Absolute Immature Granulocyte 0.2 (H) 0.00 - 0.04 K/UL    Differential Type SMEAR SCANNED      RBC Comment NORMOCYTIC, NORMOCHROMIC      Comprehensive Metabolic Panel    Collection Time: 01/12/22 10:39 PM   Result Value Ref Range    Sodium 142 136 - 145 mmol/L    Potassium 3.7 3.5 - 5.1 mmol/L    Chloride 104 97 - 108 mmol/L    CO2 23 21 - 32 mmol/L    Anion Gap 15 5 - 15 mmol/L    Glucose 148 (H) 65 - 100 mg/dL    BUN 13 6 - 20 MG/DL    Creatinine 1.32 (H) 0.55 - 1.02 MG/DL    Bun/Cre Ratio 10 (L) 12 - 20      Est, Glom Filt Rate 57 (L) >60 ml/min/1.48m    Calcium 9.5 8.5 - 10.1 MG/DL    Total Bilirubin 0.5 0.2 - 1.0 MG/DL    ALT 25 12 - 78 U/L    AST 25  15 - 37 U/L    Alk Phosphatase 62 45 - 117 U/L    Total Protein 7.8 6.4 - 8.2 g/dL    Albumin 3.6 3.5 - 5.0 g/dL    Globulin 4.2 (H) 2.0 - 4.0 g/dL    Albumin/Globulin Ratio 0.9 (L) 1.1 - 2.2     Lipase    Collection Time: 01/12/22 10:39 PM   Result Value Ref Range    Lipase 13 13 - 75 U/L   HCG Qualitative, Serum    Collection Time: 01/12/22 10:39 PM   Result Value Ref Range    hCG Qual Negative NEG         No orders to display     _0 @  The exam and treatment you received in the Emergency Department were for an urgent problem and are not intended as complete care. It is important that you follow up with a doctor, nurse practitioner, or physician assistant for ongoing care. If your symptoms become worse or you do not improve as expected and you are unable to reach your usual health care provider, you should return to the Emergency Department. We are available 24 hours a day.    Please take your discharge instructions with you when you go to your follow-up appointment.     If a prescription has been provided, please have it filled as soon as possible to prevent a delay in treatment. Read the entire medication instruction sheet provided to you by the pharmacy. If you have any questions or reservations about taking the medication due to side effects or interactions with other medications, please call your primary care physician or contact the ER to speak with the charge nurse.     Please make an  appointment with your family doctor or the physician you were referred to for follow-up of this visit as instructed on your discharge paperwork. Return to the ER if you are unable to be seen or if you are unable to be seen in a timely manner.    If you have any problem arranging the follow-up visit, contact the Emergency Department immediately.

## 2023-11-27 LAB — N. GONORRHOEAE, EXTERNAL RESULT: N. Gonorrhoeae, External Result: NEGATIVE

## 2023-11-27 LAB — HIV, EXTERNAL RESULT: HIV, External Result: NONREACTIVE

## 2023-11-27 LAB — C. TRACHOMATIS, EXTERNAL RESULT: C. Trachomatis, External Result: NEGATIVE

## 2023-11-27 LAB — HEPATITIS B, EXTERNAL RESULT: Hep B, External Result: NEGATIVE

## 2023-11-27 LAB — RPR, EXTERNAL RESULT: RPR, External Result: NONREACTIVE

## 2023-11-27 LAB — RUBELLA TITER, EXTERNAL RESULT: Rubella Titer, External Result: NON-IMMUNE/NOT IMMUNE

## 2023-12-06 ENCOUNTER — Emergency Department: Admit: 2023-12-06 | Payer: BLUE CROSS/BLUE SHIELD

## 2023-12-06 ENCOUNTER — Inpatient Hospital Stay
Admit: 2023-12-06 | Discharge: 2023-12-06 | Disposition: A | Discharge status: Home / Self Care | Payer: BLUE CROSS/BLUE SHIELD | Arrived: WI | Attending: Emergency Medicine

## 2023-12-06 DIAGNOSIS — O200 Threatened abortion: Principal | ICD-10-CM

## 2023-12-06 LAB — URINALYSIS WITH REFLEX TO CULTURE
BACTERIA, URINE: NEGATIVE /HPF
Bilirubin, Urine: NEGATIVE
Glucose, Ur: NEGATIVE mg/dL
Ketones, Urine: NEGATIVE mg/dL
Leukocyte Esterase, Urine: NEGATIVE
Nitrite, Urine: NEGATIVE
Protein, UA: NEGATIVE mg/dL
Specific Gravity, UA: 1.005 (ref 1.003–1.030)
Urobilinogen, Urine: 0.2 EU/dL (ref 0.2–1.0)
pH, Urine: 7.5 (ref 5.0–8.0)

## 2023-12-06 LAB — COMPREHENSIVE METABOLIC PANEL
ALT: 10 U/L (ref 10–35)
AST: 18 U/L (ref 10–35)
Albumin/Globulin Ratio: 1 — ABNORMAL LOW (ref 1.1–2.2)
Albumin: 3.4 g/dL — ABNORMAL LOW (ref 3.5–5.2)
Alk Phosphatase: 46 U/L (ref 35–104)
Anion Gap: 14 mmol/L (ref 2–14)
BUN/Creatinine Ratio: 10 — ABNORMAL LOW (ref 12–20)
BUN: 6 mg/dL (ref 6–20)
CO2: 19 mmol/L — ABNORMAL LOW (ref 20–29)
Calcium: 9.3 mg/dL (ref 8.6–10.0)
Chloride: 103 mmol/L (ref 98–107)
Creatinine: 0.62 mg/dL (ref 0.60–1.00)
Est, Glom Filt Rate: >90 ml/min/1.73m2 (ref >59)
Globulin: 3.4 g/dL (ref 2.0–4.0)
Glucose: 102 mg/dL — ABNORMAL HIGH (ref 65–100)
Potassium: 3.4 mmol/L — ABNORMAL LOW (ref 3.5–5.1)
Sodium: 136 mmol/L (ref 136–145)
Total Bilirubin: 0.4 mg/dL (ref 0.0–1.2)
Total Protein: 6.8 g/dL (ref 6.4–8.3)

## 2023-12-06 LAB — CBC WITH AUTO DIFFERENTIAL
Basophils %: 0.6 % (ref 0.0–1.0)
Basophils Absolute: 0.06 K/UL (ref 0.00–0.10)
Eosinophils %: 2.1 % (ref 0.0–7.0)
Eosinophils Absolute: 0.22 K/UL (ref 0.00–0.40)
Hematocrit: 35.1 % (ref 35.0–47.0)
Hemoglobin: 12.2 g/dL (ref 11.5–16.0)
Immature Granulocytes %: 0.2 % (ref 0.0–0.5)
Immature Granulocytes Absolute: 0.02 K/UL (ref 0.00–0.04)
Lymphocytes %: 41.3 % (ref 12.0–49.0)
Lymphocytes Absolute: 4.37 K/UL — ABNORMAL HIGH (ref 0.80–3.50)
MCH: 30.7 pg (ref 26.0–34.0)
MCHC: 34.8 g/dL (ref 30.0–36.5)
MCV: 88.4 FL (ref 80.0–99.0)
MPV: 9.8 FL (ref 8.9–12.9)
Monocytes %: 7.1 % (ref 5.0–13.0)
Monocytes Absolute: 0.75 K/UL (ref 0.00–1.00)
Neutrophils %: 48.7 % (ref 32.0–75.0)
Neutrophils Absolute: 5.15 K/UL (ref 1.80–8.00)
Nucleated RBCs: 0 /100{WBCs}
Platelets: 332 K/uL (ref 150–400)
RBC: 3.97 M/uL (ref 3.80–5.20)
RDW: 12.3 % (ref 11.5–14.5)
WBC: 10.6 K/uL (ref 3.6–11.0)
nRBC: 0 K/uL (ref 0.00–0.01)

## 2023-12-06 LAB — US OB LESS THAN 14 WEEKS SINGLE OR FIRST GESTATION
CRL: 2.85 cm
CRL: 2.98 cm
CRL: 3.06 cm
CRL: 3.33 cm

## 2023-12-06 LAB — ABO/RH: ABO/Rh: A POS

## 2023-12-06 LAB — HCG, QUANTITATIVE, PREGNANCY: hCG Quant: 105541 m[IU]/mL

## 2023-12-06 LAB — EXTRA TUBES HOLD

## 2023-12-06 LAB — POC PREGNANCY UR-QUAL: Preg Test, Ur: POSITIVE — AB

## 2023-12-06 NOTE — Discharge Instructions (Signed)
 Thank you for allowing [place] to provide you with medical care today.  We realize that you have many choices for your emergency care needs.  We thank you for choosing [facility].  Please choose [place] in the future for any continued health care needs.     The exam and treatment you received in the Emergency Department were for an emergent problem and are not intended as complete care. It is important that you follow up with a doctor, nurse practitioner, or physician assistant for ongoing care. If your symptoms worsen or you do not improve as expected and you are unable to reach your usual health care provider, you should return to the Emergency Department. We are available 24 hours a day.     Please make an appointment with your healthcare provider(s) for follow up of your Emergency Department visit.  Take this sheet with you when you go to your follow-up visit.    Continue to monitor symptoms at home. Follow-up with PCP and return with any changes or worsening.

## 2023-12-06 NOTE — Other (Signed)
 DC home w/instructions after PIV removal. Verbalized understanding and agreement of instructions/follow up. Ambulatory from dept w/o assistance accompanied by SO.

## 2023-12-06 NOTE — ED Provider Notes (Signed)
 [facility]  EMERGENCY DEPARTMENT ENCOUNTER      Pt Name: Kelsey Ray  MRN: [id number]  Birthdate [birth year]  Date of evaluation: 12/06/2023  Provider: Lavanda HERO Jerri Hargadon, PA-C    CHIEF COMPLAINT       Chief Complaint   Patient presents with    Pregnancy Problem         HISTORY OF PRESENT ILLNESS   (Location/Symptom, Timing/Onset, Context/Setting, Quality, Duration, Modifying Factors, Severity)  Note limiting factors.   28 year old G1, P0 female 9 to 10 weeks estimated gestational age with no significant past medical history presenting to the emergency department with vaginal bleeding.  States LMP was July 10.  The bleeding started today.  She states it is not heavy enough to wear a menstrual pad but that she notices the bleeding each time she uses the bathroom.  She has not noticed any clots.  She denies any associated abdominal pain, fevers, dysuria.  States she has established care with an OB and has had an ultrasound where an IUP has been documented.     The history is provided by the patient.        Review of External Medical Records:     Nursing Notes were reviewed.    REVIEW OF SYSTEMS    (2-9 systems for level 4, 10 or more for level 5)     Review of Systems    Except as noted above the remainder of the review of systems was reviewed and negative.       PAST MEDICAL HISTORY   No past medical history on file.      SURGICAL HISTORY     No past surgical history on file.      CURRENT MEDICATIONS       Previous Medications    DICYCLOMINE  (BENTYL ) 20 MG TABLET    Take 1 tablet by mouth 4 times daily    ONDANSETRON  (ZOFRAN ) 4 MG TABLET    Take 1 tablet by mouth 3 times daily as needed for Nausea or Vomiting       ALLERGIES     Latex, Amoxicillin, and Pcn [penicillins]    FAMILY HISTORY     No family history on file.       SOCIAL HISTORY       Social History     Socioeconomic History    Marital status: Single           PHYSICAL EXAM    (up to 7 for level 4, 8 or more for level 5)     ED  Triage Vitals [12/06/23 0024]   BP Girls Systolic BP Percentile Girls Diastolic BP Percentile Boys Systolic BP Percentile Boys Diastolic BP Percentile Temp Temp Source Pulse   (!) 148/106 -- -- -- -- 98.3 F (36.8 C) Temporal 86      Respirations SpO2 Height Weight - Scale       20 98 % -- 96.3 kg (212 lb 4.9 oz)           Body mass index is 34.27 kg/m.    Physical Exam  Constitutional:       Appearance: Normal appearance.   HENT:      Head: Normocephalic and atraumatic.      Mouth/Throat:      Mouth: Mucous membranes are moist.   Eyes:      Extraocular Movements: Extraocular movements intact.      Conjunctiva/sclera: Conjunctivae normal.   Cardiovascular:      Rate  and Rhythm: Normal rate.   Pulmonary:      Effort: Pulmonary effort is normal.   Abdominal:      General: Abdomen is flat.      Palpations: Abdomen is soft.      Tenderness: There is no abdominal tenderness.   Skin:     General: Skin is warm and dry.   Neurological:      Mental Status: She is alert. Mental status is at baseline.   Psychiatric:         Mood and Affect: Mood normal.         Behavior: Behavior normal.         DIAGNOSTIC RESULTS     EKG: All EKG's are interpreted by the Emergency Department Physician who either signs or Co-signs this chart in the absence of a cardiologist.        RADIOLOGY:   Non-plain film images such as CT, Ultrasound and MRI are read by the radiologist. Plain radiographic images are visualized and preliminarily interpreted by the emergency physician with the below findings:        Interpretation per the Radiologist below, if available at the time of this note:    US  OB LESS THAN 14 WEEKS SINGLE OR FIRST GESTATION   Final Result   Single viable intrauterine pregnancy with estimated gestational age   of [redacted] weeks 6 days. Normal sonographic appearance of the right ovary. The left   ovary is not visualized.         Electronically signed by DARICE COLON           LABS:  Labs Reviewed   CBC WITH AUTO DIFFERENTIAL - Abnormal;  Notable for the following components:       Result Value    Lymphocytes Absolute 4.37 (*)     All other components within normal limits   COMPREHENSIVE METABOLIC PANEL - Abnormal; Notable for the following components:    Potassium 3.4 (*)     CO2 19 (*)     Glucose 102 (*)     BUN/Creatinine Ratio 10 (*)     Albumin 3.4 (*)     Albumin/Globulin Ratio 1.0 (*)     All other components within normal limits   URINALYSIS WITH REFLEX TO CULTURE - Abnormal; Notable for the following components:    Blood, Urine SMALL (*)     All other components within normal limits   POC PREGNANCY UR-QUAL - Abnormal; Notable for the following components:    Preg Test, Ur Positive (*)     All other components within normal limits   HCG, QUANTITATIVE, PREGNANCY   EXTRA TUBES HOLD   POC PREGNANCY UR-QUAL   ABO/RH       All other labs were within normal range or not returned as of this dictation.    EMERGENCY DEPARTMENT COURSE and DIFFERENTIAL DIAGNOSIS/MDM:   Vitals:    Vitals:    12/06/23 0024   BP: (!) 148/106   Pulse: 86   Resp: 20   Temp: 98.3 F (36.8 C)   TempSrc: Temporal   SpO2: 98%   Weight: 96.3 kg (212 lb 4.9 oz)           Medical Decision Making  28 year old G1, P0 female 9 to 10 weeks estimated gestational age with no significant past medical history presenting to the emergency department with vaginal bleeding.  States LMP was July 10.  The bleeding started today.  She states it is not heavy enough to  wear a menstrual pad but that she notices the bleeding each time she uses the bathroom.  She has not noticed any clots.  She denies any associated abdominal pain, fevers, dysuria.  States she has established care with an OB and has had an ultrasound where an IUP has been documented.     Patient is hemodynamically stable and afebrile on arrival.  She does have an elevated blood pressure of 148/106.  Abdomen is soft, nondistended, nontender.  CBC without leukocytosis or anemia.  She has a mild hypokalemia of 3.4.  CMP otherwise  unremarkable.  She has small amounts of blood in the urine without any evidence of infection.  Beta-hCG is 105,541 which is consistent with her gestational age.  OB US  shows normal IUP measuring 9 weeks and 6 days. Findings discussed with patient. She was recommended to follow-up closely with her OB/GYN and was otherwise educated on expectant management. She was provided strict return precautions and discharged in stable condition.     Amount and/or Complexity of Data Reviewed  Labs: ordered. Decision-making details documented in ED Course.  Radiology: ordered.            REASSESSMENT     ED Course as of 12/06/23 0233   Sat Dec 06, 2023   0205 HCG, Beta: 105,541 [AC]   0206 Bacteria, UA: Negative [AC]   0206 Leukocyte Esterase, Urine: Negative [AC]   0206 WBC, UA: 0-4 [AC]   0206 WBC: 10.6 [AC]   0206 Hemoglobin Quant: 12.2 [AC]      ED Course User Index  [AC] Angelly Spearing M, PA-C           CONSULTS:  None    PROCEDURES:  Unless otherwise noted below, none     Procedures      FINAL IMPRESSION      1. Threatened abortion    2. Vaginal bleeding during pregnancy          DISPOSITION/PLAN   DISPOSITION Decision To Discharge 12/06/2023 02:31:32 AM      PATIENT REFERRED TO:  OB/GYN    Schedule an appointment as soon as possible for a visit in 2 days      [facility] Emergency Department  [address]  [place]  [phone]  [phone]  Go to   If symptoms worsen      DISCHARGE MEDICATIONS:  New Prescriptions    No medications on file         (Please note that portions of this note were completed with a voice recognition program.  Efforts were made to edit the dictations but occasionally words are mis-transcribed.)    Lavanda HERO Saron Vanorman, PA-C (electronically signed)  Emergency Attending Physician / Physician Assistant / Nurse Practitioner            Netanel Yannuzzi, Lavanda HERO, PA-C  12/06/23 0234

## 2023-12-06 NOTE — ED Triage Notes (Signed)
 Triage: Pt arrives ambulatory from home with CC of vaginal bleeding during pregnancy. She reports LMP was in July. She reports she is between 9-10 weeks. She has had an OB visit with ultrasound where an IUP was noted. The bleeding is not heavy enough to wear a menstrual pad. She reports she is noticing the bleeding when she urinates. She denies abdominal cramping.

## 2024-02-12 ENCOUNTER — Emergency Department: Admit: 2024-02-12 | Payer: BLUE CROSS/BLUE SHIELD

## 2024-02-12 ENCOUNTER — Inpatient Hospital Stay
Admit: 2024-02-12 | Discharge: 2024-02-12 | Disposition: A | Discharge status: Home / Self Care | Payer: BLUE CROSS/BLUE SHIELD | Arrived: VH | Attending: Emergency Medicine

## 2024-02-12 DIAGNOSIS — O2302 Infections of kidney in pregnancy, second trimester: Principal | ICD-10-CM

## 2024-02-12 DIAGNOSIS — R10A2 Flank pain, left side: Secondary | ICD-10-CM

## 2024-02-12 LAB — COMPREHENSIVE METABOLIC PANEL
ALT: 13 U/L (ref 10–35)
AST: 21 U/L (ref 10–35)
Albumin/Globulin Ratio: 0.8 — ABNORMAL LOW (ref 1.1–2.2)
Albumin: 3.1 g/dL — ABNORMAL LOW (ref 3.5–5.2)
Alk Phosphatase: 53 U/L (ref 35–104)
Anion Gap: 15 mmol/L — ABNORMAL HIGH (ref 2–14)
BUN/Creatinine Ratio: 14 (ref 12–20)
BUN: 11 mg/dL (ref 6–20)
CO2: 20 mmol/L (ref 20–29)
Calcium: 9.5 mg/dL (ref 8.6–10.0)
Chloride: 110 mmol/L — ABNORMAL HIGH (ref 98–107)
Creatinine: 0.75 mg/dL (ref 0.60–1.00)
Est, Glom Filt Rate: >90 ml/min/1.73m2 (ref >59)
Globulin: 3.8 g/dL (ref 2.0–4.0)
Glucose: 108 mg/dL — ABNORMAL HIGH (ref 65–100)
Potassium: 3.9 mmol/L (ref 3.5–5.1)
Sodium: 144 mmol/L (ref 136–145)
Total Bilirubin: <0.2 mg/dL (ref 0.0–1.2)
Total Protein: 6.9 g/dL (ref 6.4–8.3)

## 2024-02-12 LAB — URINALYSIS WITH MICROSCOPIC
Bilirubin, Urine: NEGATIVE
Glucose, Ur: NEGATIVE mg/dL
Ketones, Urine: 15 mg/dL — AB
Nitrite, Urine: NEGATIVE
Protein, UA: 100 mg/dL — AB
Specific Gravity, UA: 1.028 (ref 1.003–1.030)
Urobilinogen, Urine: 0.2 EU/dL (ref 0.2–1.0)
WBC, UA: >100 /HPF — ABNORMAL HIGH (ref 0–4)
pH, Urine: 5.5 (ref 5.0–8.0)

## 2024-02-12 LAB — CBC WITH AUTO DIFFERENTIAL
Basophils %: 0.3 % (ref 0.0–1.0)
Basophils Absolute: 0.06 K/UL (ref 0.00–0.10)
Eosinophils %: 0.7 % (ref 0.0–7.0)
Eosinophils Absolute: 0.13 K/UL (ref 0.00–0.40)
Hematocrit: 35.1 % (ref 35.0–47.0)
Hemoglobin: 12.3 g/dL (ref 11.5–16.0)
Immature Granulocytes %: 0.5 % (ref 0.0–0.5)
Immature Granulocytes Absolute: 0.08 K/UL — ABNORMAL HIGH (ref 0.00–0.04)
Lymphocytes %: 13.3 % (ref 12.0–49.0)
Lymphocytes Absolute: 2.32 K/UL (ref 0.80–3.50)
MCH: 30.9 pg (ref 26.0–34.0)
MCHC: 35 g/dL (ref 30.0–36.5)
MCV: 88.2 FL (ref 80.0–99.0)
MPV: 9.8 FL (ref 8.9–12.9)
Monocytes %: 5.7 % (ref 5.0–13.0)
Monocytes Absolute: 0.99 K/UL (ref 0.00–1.00)
Neutrophils %: 79.5 % — ABNORMAL HIGH (ref 32.0–75.0)
Neutrophils Absolute: 13.88 K/UL — ABNORMAL HIGH (ref 1.80–8.00)
Nucleated RBCs: 0 /100{WBCs}
Platelets: 342 K/uL (ref 150–400)
RBC: 3.98 M/uL (ref 3.80–5.20)
RDW: 12.5 % (ref 11.5–14.5)
WBC: 17.5 K/uL — ABNORMAL HIGH (ref 3.6–11.0)
nRBC: 0 K/uL (ref 0.00–0.01)

## 2024-02-12 LAB — US OB 1 OR MORE FETUS LIMITED
Abdominal Circumference: 14.43 cm
Abdominal Circumference: 14.43 cm
Biparietal Diameter: 4.6 cm
Biparietal Diameter: 4.6 cm
Head Circumference: 16.79 cm
Head Circumference: 16.79 cm

## 2024-02-12 LAB — EXTRA TUBES HOLD

## 2024-02-12 LAB — LIPASE: Lipase: 17 U/L (ref 13–60)

## 2024-02-12 LAB — URINE CULTURE HOLD SAMPLE

## 2024-02-12 MED ORDER — CEFPODOXIME PROXETIL 200 MG PO TABS
200 | ORAL_TABLET | Freq: Two times a day (BID) | ORAL | 0 refills | 10.00000 days | Status: AC
Start: 2024-02-12 — End: 2024-02-22

## 2024-02-12 MED ORDER — SODIUM CHLORIDE 0.9 % IV BOLUS
0.9 | Freq: Once | INTRAVENOUS | Status: AC
Start: 2024-02-12 — End: 2024-02-12
  Administered 2024-02-12: 19:00:00 1000 mL via INTRAVENOUS

## 2024-02-12 MED ORDER — ACETAMINOPHEN 500 MG PO TABS
500 | ORAL_TABLET | ORAL | 0 refills | 9.00000 days | Status: AC | PRN
Start: 2024-02-12 — End: 2024-02-26

## 2024-02-12 MED ORDER — STERILE WATER FOR INJECTION (MIXTURES ONLY)
2 | INTRAMUSCULAR | Status: AC
Start: 2024-02-12 — End: 2024-02-12
  Administered 2024-02-12: 21:00:00 2000 mg via INTRAVENOUS

## 2024-02-12 MED ORDER — ACETAMINOPHEN 500 MG PO TABS
500 | ORAL | Status: AC
Start: 2024-02-12 — End: 2024-02-12
  Administered 2024-02-12: 19:00:00 1000 mg via ORAL

## 2024-02-12 MED FILL — CEFTRIAXONE SODIUM 2 G IJ SOLR: 2 g | INTRAMUSCULAR | Qty: 2000 | Fill #0

## 2024-02-12 MED FILL — ACETAMINOPHEN EXTRA STRENGTH 500 MG PO TABS: 500 mg | ORAL | Qty: 2 | Fill #0

## 2024-02-12 MED FILL — SODIUM CHLORIDE 0.9 % IV SOLN: 0.9 % | INTRAVENOUS | Qty: 1000 | Fill #0

## 2024-02-12 NOTE — ED Triage Notes (Signed)
"  Pt reports having pain that is on left side that wraps around to left flank area. Pt states pain is 10/10, Pt denies any urinary issues, blood or fever. Pt is A&Ox4 skin warm and dry, appears in pain in triage unable to sit still due to pain, breathing even and unlabored, ambulatory to triage with steady gait.   "

## 2024-02-12 NOTE — ED Notes (Signed)
"  28-year female G1 currently 19 weeks and 5 days pregnant presenting with left-sided abdominal and flank pain since 9 AM this morning that has been persistent.  Her OB is Dr.Yuvani?  At [facility].  The patient was told that the on-call provider would see her when she came here.  She does endorse some intermittent nausea and vomiting throughout this pregnancy and denies any urinary symptoms or recent fevers or chills.  She denies any vaginal bleeding.  Last bowel movement was yesterday and she does report some constipation.  Past medical history significant for PCOS.    1:41 PM  I have evaluated the patient as the Provider in Rapid Medical Evaluation (RME). I have reviewed her vital signs and the triage nurse assessment. I have talked with the patient and any available family and advised that I am the provider in triage and have ordered the appropriate study to initiate their work up based on the clinical presentation during my assessment. I have advised that the patient will be accommodated in the Main ED as soon as possible. I have also requested to contact the triage nurse or myself immediately if the patient experiences any changes in their condition during this brief waiting period.  Via Rosado G Karishma Unrein, PA-C       Vaden Becherer G, PA-C  02/12/24 1341    "

## 2024-02-12 NOTE — ED Provider Notes (Signed)
 "      [facility]  EMERGENCY DEPARTMENT ENCOUNTER      Pt Name: Kelsey Ray  MRN: [id number]  Birthdate 08-Sep-1995  Date of evaluation: 02/12/2024  Provider: Donnice Melnick, MD      HISTORY OF PRESENT ILLNESS      HPI  28 year old female [redacted] weeks pregnant presenting to the emergency department due to left flank pain.  She noticed the symptoms at 9 AM this morning.  She says the pain is constant but the intensity comes in waves.  She says it is made worse with movement.  She is pointing to pain in the axillary line on the left between her left hip and left chest wall.  Denies any nausea or vomiting apart from symptoms she has had throughout her pregnancy.  She denies urinary symptoms.  Denies vaginal bleeding or leakage of fluid.  Endorses fetal movement.  No fevers or chills.  No diarrhea or blood in her stool.  No cough chest pain or shortness of breath      Nursing Notes were reviewed.    REVIEW OF SYSTEMS         Review of Systems  All systems reviewed were negative unless otherwise document in the HPI      PAST MEDICAL HISTORY   No past medical history on file.      SURGICAL HISTORY     No past surgical history on file.      CURRENT MEDICATIONS       Previous Medications    DICYCLOMINE  (BENTYL ) 20 MG TABLET    Take 1 tablet by mouth 4 times daily    ONDANSETRON  (ZOFRAN ) 4 MG TABLET    Take 1 tablet by mouth 3 times daily as needed for Nausea or Vomiting       ALLERGIES     Latex, Amoxicillin, and Pcn [penicillins]    FAMILY HISTORY     No family history on file.       SOCIAL HISTORY       Social History     Socioeconomic History    Marital status: Single         PHYSICAL EXAM         Body mass index is 39.22 kg/m.    Physical Exam  Constitutional:       Comments: Appears tearful but nontoxic   Eyes:      General: No scleral icterus.     Extraocular Movements: Extraocular movements intact.   Cardiovascular:      Rate and Rhythm: Normal rate and regular rhythm.   Pulmonary:      Effort:  Pulmonary effort is normal. No respiratory distress.      Breath sounds: Normal breath sounds. No wheezing or rales.   Abdominal:      Palpations: Abdomen is soft.      Comments: No distention.  Normal bowel sounds.  No significant abdominal tenderness.  Questionable CVA tenderness.  She jumps when I initially percussed her left flank but patient says this startled her which happens due to her autism   Skin:     General: Skin is warm and dry.      Comments: No rash on the back abdomen or flanks   Neurological:      Comments: Awake and alert.  GCS 15             EMERGENCY DEPARTMENT COURSE and DIFFERENTIAL DIAGNOSIS/MDM:   Vitals:    Vitals:  02/12/24 1336   BP: (!) 146/88   Pulse: 71   Resp: 16   Temp: 97.8 F (36.6 C)   TempSrc: Tympanic   SpO2: 98%   Weight: 106.9 kg (235 lb 10.8 oz)   Height: 1.651 m (5' 5)         Medical Decision Making  Amount and/or Complexity of Data Reviewed  Labs: ordered.  Radiology: ordered.    Risk  OTC drugs.  Prescription drug management.      Patient presenting with the above chief complaint.  Vital signs are stable.  She is tearful on exam.  Her abdomen is benign with no significant tenderness noted.  Questionable left CVA tenderness present.  Workup included a pelvic ultrasound showing a normal-appearing fetus with no obvious abnormal findings.  Renal ultrasound shows no evidence of hydronephrosis or any other acute processes.  Urinalysis is grossly infected and given the location of pain likely experiencing pyelonephritis.  She does have a white count of 17.  This could impart be due to her gestational age in addition to the infection we have identified.  She is not septic appearing and she has stable vital signs and is appropriate for outpatient antibiotic therapy.  She was given Rocephin  in the emergency department.  Urine sent for culture and she will be started on cefpodoxime .  Symptoms have improved after Tylenol .  Discussed continued symptomatic management at home and  return precautions and patient discharged in stable condition      REASSESSMENT          CONSULTS:  None    PROCEDURES:     Critical Care    Performed by: Myrle Cough, MD  Authorized by: Myrle Cough, MD    Critical care provider statement:     Critical care time (minutes):  45    Critical care time was exclusive of:  Separately billable procedures and treating other patients    Critical care was necessary to treat or prevent imminent or life-threatening deterioration of the following conditions: Pyelonephritis receiving a dose of IV antibiotics.    Critical care was time spent personally by me on the following activities:  Blood draw for specimens, development of treatment plan with patient or surrogate, evaluation of patient's response to treatment, examination of patient, obtaining history from patient or surrogate, ordering and performing treatments and interventions, ordering and review of laboratory studies, ordering and review of radiographic studies, pulse oximetry, re-evaluation of patient's condition and review of old charts    I assumed direction of critical care for this patient from another provider in my specialty: no              (Please note that portions of this note were completed with a voice recognition program.  Efforts were made to edit the dictations but occasionally words are mis-transcribed.)    Cough Myrle, MD (electronically signed)  Emergency Attending Physician              Myrle Cough, MD  02/12/24 1939    "

## 2024-02-12 NOTE — Discharge Instructions (Addendum)
"  Return to the emergency department if your symptoms are getting significantly worse, you are unable to take your antibiotics, have persistent nausea and vomiting, have persistent fevers not improving with Tylenol  or any other symptoms you find concerning.  In the meantime please take the antibiotics as prescribed and follow-up with your OB/GYN/primary care doctor  "

## 2024-02-13 LAB — CULTURE, URINE: Culture: NO GROWTH

## 2024-03-10 DIAGNOSIS — R52 Pain, unspecified: Secondary | ICD-10-CM
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
## 2024-03-10 NOTE — Progress Notes (Signed)
"  OB Hospitalist    28 y/o G1 @ 23 weeks 6 days presents with c/o mucous discharge, vaginal spotting yesterday all day , got better overnight.  Since today evening her cramping had gotten intense with blood tinged discharge.  No LOF  AFM   V scan confirms Breech  VSS AF  Alert and oriented, tearful  CTA B/L BS  RRR  Abdomen gravid, relaxed, obese  Pelvic exam 5cm dilated/50/membranes bulging at the os: No fetal parts palpable    A/P  IUP@ 23 weeks 6 days with PTL  Admit to L &D  IV Magnesium  6 grams loading dose  Steroids  IV Ancef (Allergic to PCN)  CBC, U/A, GBS  NICU consult  MFM consult -Dr. Von was consulted and concur the plan as above  Recommendations for delivery would be CD (Classical C-Section) if pt continues to be Breech and active Labor as well as NRFHR.  NICU team had an extensive discussion with the  parents and they want full resuscitation  Pt verbalizes understanding and all questions answered.    Casey Rafter MD    "

## 2024-03-10 NOTE — Progress Notes (Addendum)
"  2024:  Patient arrives to OBED ambulatory with FOB c/o vaginal bleeding since yesterday and abdominal cramping since ~ 1945.  Patient endorses + fetal movement, however she states it has been slightly decreased from baseline.  Patient states she has history of UTIs and declines other pregnancy complications.    2030:  Fetal movement audible.  Additional nursing staff requested at bedside due to difficulty finding FHT.    2034BETHA GORMAN Caroline, CNM notified of patient arrival and SBAR.    2035:  Report given to VEAR Risen, RN.  "

## 2024-03-10 NOTE — Consults (Unsigned)
 "[facility]  Prenatal Consult    Jennife, Zaucha MRN: [id number] [facility]: [id number]  Note Date: 03/10/2024   Note Time: 21:30  Place of Service: Labor and Delivery   Requested By: Toy Pen, MD    Reason for Consultation: 28 YO G1 mother presented to L&D with concern for PTL.      Maternal History  DOB: [birth year]   Mother's Age: 67   Mother's Blood Type: A Pos  Mother's Race: White   Mother's Ethnicity: Not Hispanic or Latino   G: 1  Syphilis: Unknown   HIV: Unknown     Rubella: Non-Immune   GBS: Unknown   HBsAg: Unknown   Hep C: Unknown  GC: Unknown   Chlamydia: Unknown  Prenatal Care: Yes    EDC OB: 07/01/2024    Family History:  Diabetes    Pregnancy Complications  Breech presentation (O32.1XX0)    Obesity, 2nd trimester (O99.212)    Premature onset of labor, w/o delivery, 2nd trimester (O60.02)    Urinary tract infection, 2nd trimester (O23.42)   Comment: history    [redacted] weeks gestation of pregnancy (Z3A.23)    Maternal Steroids: Yes  Last Dose Date: 03/10/2024    Maternal Medications: Yes  Betamethasone     Magnesium  Sulfate    Other   Comment: Bentyl     Penicillin     Pregnancy Comment  28 YO G1 mother presented to L&D with concern for PTL.     Present Plan  Admit to L&D  Abx  Mag and BTMZ  Maternal labs      Discussion/Counseling  See note below for discussion.  Consents for NICU procedures, blood transfusion,  and donor breastmilk obtained,    Recommendations  Thank you for inviting me to speak with the family.  We remain available if the  family desires further discussion or clarification. I have reviewed the mother`s  medical chart and spoken with the obstetrician requesting this consult.  I met  with the mother and father.  The baby's name will be Danette'.    Based on the gestational age of [redacted] weeks gestation (Infant is 65 6/[redacted] weeks  gestation), the infant may not be able to survive or may have significant  handicap if he/she does survive.  Using the October 2025  [employer]  Survival/Mortality Table, survival of infants admitted to the NICU is estimated  at to be approximately 65%, although this can vary depending upon BW, gender,  prenatal steroids and pre or postnatal factors.  Survival without severe  outcomes is estimated to be 41% of infants admitted to the NICU.   Severe  outcomes include a diagnosis of Grade 3 or 4 IVH, Cystic PVL, Stage 3 or 4 ROP,  Surgical ROP or retinal detachment or Grade 3 BPD.     We discussed that most any organ system can have problems related to  prematurity.  Some of the most common morbidities associated with this degree of  prematurity, including but not limited to: respiratory distress syndrome  requiring  intubation and mechanical ventilation, intraventricular hemorrhage,  retinopathy of prematurity and possible blindness, necrotizing enterocolitis  with the potential for loss of bowel,  patent ductus arteriosus, infection  requiring intravenous antibiotics, the need for intravenous nutrition and gavage  feedings,  cerebral palsy, neurodevelopmental impairment and developmental  delay, and hearing impairment.    We reviewed the family's goals of care for their infant and helped them explore  their definition of 'quality of life'. They  expressed their fears and began to  explore the level of impairment of their child that would be acceptable to them.    After lengthy discussion, the family would like to pursue a trial of life  support.  We discussed what this may look like, knowing some decisions need to  be made in real time by the care team during the resuscitation.    The neonatal delivery team will attend the delivery & provide indicated care,  generally this involves support of thermoregulation & respiratory support.  If  not responsive to resuscitation, then care will be redirected to comfort  measures only.  We would be available to support the family and infant during  this time until natural death occurs.    If responding to the  initial resuscitation, infants at this gestation commonly  need an umbilical catheter(s) to allow nutrition & monitoring.  I reviewed the  probable lab work and radiology studies that will be obtained upon admission.  When sufficiently stable, gavage feedings will be started.    We discussed the critical importance of lactation to optimize outcome (improved  neurodevelopment, reduced risk of NEC & Infections among other things).  We  discussed how we will support mothers lactation.    We discussed the unique challenge that immature skin present in this extreme  degree of prematurity and that these issues can be life threatening.    Time was allotted for the family to ask questions, and all questions were  answered to the best of my ability, given the information provided.   The family  understands and agrees with the present plan.    Thank you for inviting me to speak with the family.  We remain available if the  family desires further discussion or clarification.    Total clinician time devoted to the patient on the day of this visit was 60  minutes.    Authenticated by: ROJELIO COUPE, NNP  Date/Time: 03/10/2024 22:44  "

## 2024-03-11 ENCOUNTER — Inpatient Hospital Stay
Admission: EM | Admit: 2024-03-11 | Discharge: 2024-03-11 | Disposition: A | Discharge status: Home / Self Care | Payer: BLUE CROSS/BLUE SHIELD

## 2024-03-11 LAB — COMPREHENSIVE METABOLIC PANEL W/ REFLEX TO MG FOR LOW K
ALT: 13 U/L (ref 10–35)
AST: 16 U/L (ref 10–35)
Albumin/Globulin Ratio: 0.8 — ABNORMAL LOW (ref 1.1–2.2)
Albumin: 2.8 g/dL — ABNORMAL LOW (ref 3.5–5.2)
Alk Phosphatase: 63 U/L (ref 35–104)
Anion Gap: 13 mmol/L (ref 2–14)
BUN/Creatinine Ratio: 19 (ref 12–20)
BUN: 10 mg/dL (ref 6–20)
CO2: 21 mmol/L (ref 20–29)
Calcium: 9.2 mg/dL (ref 8.6–10.0)
Chloride: 103 mmol/L (ref 98–107)
Creatinine: 0.53 mg/dL — ABNORMAL LOW (ref 0.60–1.00)
Est, Glom Filt Rate: >90 ml/min/1.73m2 (ref >59)
Globulin: 3.5 g/dL (ref 2.0–4.0)
Glucose: 84 mg/dL (ref 65–100)
Potassium: 4.3 mmol/L (ref 3.5–5.1)
Sodium: 136 mmol/L (ref 136–145)
Total Bilirubin: <0.2 mg/dL (ref 0.0–1.2)
Total Protein: 6.2 g/dL — ABNORMAL LOW (ref 6.4–8.3)

## 2024-03-11 LAB — URINALYSIS WITH REFLEX TO CULTURE
BACTERIA, URINE: NEGATIVE /HPF
Bilirubin, Urine: NEGATIVE
Blood, Urine: NEGATIVE
Glucose, Ur: NEGATIVE mg/dL
Ketones, Urine: NEGATIVE mg/dL
Nitrite, Urine: NEGATIVE
Protein, UA: NEGATIVE mg/dL
Specific Gravity, UA: <1.005 (ref 1.003–1.030)
Urobilinogen, Urine: 0.2 EU/dL (ref 0.2–1.0)
pH, Urine: 6.5 (ref 5.0–8.0)

## 2024-03-11 LAB — CBC WITH AUTO DIFFERENTIAL
Basophils %: 0.2 % (ref 0.0–1.0)
Basophils Absolute: 0.03 K/UL (ref 0.00–0.10)
Eosinophils %: 1.7 % (ref 0.0–7.0)
Eosinophils Absolute: 0.23 K/UL (ref 0.00–0.40)
Hematocrit: 32.7 % — ABNORMAL LOW (ref 35.0–47.0)
Hemoglobin: 11.5 g/dL (ref 11.5–16.0)
Immature Granulocytes %: 0.4 % (ref 0.0–0.5)
Immature Granulocytes Absolute: 0.05 K/UL — ABNORMAL HIGH (ref 0.00–0.04)
Lymphocytes %: 36.1 % (ref 12.0–49.0)
Lymphocytes Absolute: 4.9 K/UL — ABNORMAL HIGH (ref 0.80–3.50)
MCH: 30.9 pg (ref 26.0–34.0)
MCHC: 35.2 g/dL (ref 30.0–36.5)
MCV: 87.9 FL (ref 80.0–99.0)
MPV: 10 FL (ref 8.9–12.9)
Monocytes %: 7.5 % (ref 5.0–13.0)
Monocytes Absolute: 1.02 K/UL — ABNORMAL HIGH (ref 0.00–1.00)
Neutrophils %: 54.1 % (ref 32.0–75.0)
Neutrophils Absolute: 7.33 K/UL (ref 1.80–8.00)
Nucleated RBCs: 0 /100{WBCs}
Platelets: 317 K/uL (ref 150–400)
RBC: 3.72 M/uL — ABNORMAL LOW (ref 3.80–5.20)
RDW: 12.3 % (ref 11.5–14.5)
WBC: 13.6 K/uL — ABNORMAL HIGH (ref 3.6–11.0)
nRBC: 0 K/uL (ref 0.00–0.01)

## 2024-03-11 LAB — TYPE AND SCREEN
ABO/Rh: A POS
Antibody Screen: NEGATIVE

## 2024-03-11 MED ORDER — LACTATED RINGERS IV BOLUS
INTRAVENOUS | Status: DC | PRN
Start: 2024-03-11 — End: 2024-03-14

## 2024-03-11 MED ORDER — OXYTOCIN 30 UNITS IN 500 ML INFUSION
30 | INTRAVENOUS | Status: AC
Start: 2024-03-11 — End: 2024-03-11

## 2024-03-11 MED ORDER — ACETAMINOPHEN 650 MG RE SUPP
650 | RECTAL | Status: DC | PRN
Start: 2024-03-11 — End: 2024-03-14

## 2024-03-11 MED ORDER — NALOXONE HCL 0.4 MG/ML IJ SOLN
0.4 | INTRAMUSCULAR | Status: DC | PRN
Start: 2024-03-11 — End: 2024-03-11

## 2024-03-11 MED ORDER — NORMAL SALINE FLUSH 0.9 % IV SOLN
0.9 | INTRAVENOUS | Status: DC | PRN
Start: 2024-03-11 — End: 2024-03-14

## 2024-03-11 MED ORDER — LOPERAMIDE HCL 2 MG PO CAPS
2 | ORAL | Status: DC | PRN
Start: 2024-03-11 — End: 2024-03-14

## 2024-03-11 MED ORDER — BUPIVACAINE HCL (PF) 0.25 % IJ SOLN
0.25 | INTRAMUSCULAR | Status: AC
Start: 2024-03-11 — End: 2024-03-11

## 2024-03-11 MED ORDER — PROCHLORPERAZINE EDISYLATE 10 MG/2ML IJ SOLN
10 | Freq: Once | INTRAMUSCULAR | Status: DC | PRN
Start: 2024-03-11 — End: 2024-03-14

## 2024-03-11 MED ORDER — OXYTOCIN-LACTATED RINGERS 30 UNIT/500ML IV SOLN
30 | INTRAVENOUS | Status: DC | PRN
Start: 2024-03-11 — End: 2024-03-11

## 2024-03-11 MED ORDER — ACETAMINOPHEN 500 MG PO TABS
500 | Freq: Three times a day (TID) | ORAL | Status: DC
Start: 2024-03-11 — End: 2024-03-14
  Administered 2024-03-11 – 2024-03-14 (×9): 1000 mg via ORAL

## 2024-03-11 MED ORDER — ONDANSETRON HCL 4 MG/2ML IJ SOLN
4 | INTRAMUSCULAR | Status: AC
Start: 2024-03-11 — End: 2024-03-11

## 2024-03-11 MED ORDER — NALBUPHINE HCL 10 MG/ML IJ SOLN
10 | INTRAMUSCULAR | Status: DC | PRN
Start: 2024-03-11 — End: 2024-03-11

## 2024-03-11 MED ORDER — LANSINOH LANOLIN EX CREA
CUTANEOUS | Status: DC | PRN
Start: 2024-03-11 — End: 2024-03-14

## 2024-03-11 MED ORDER — EPHEDRINE SULFATE (PRESSORS) 50 MG/ML IV SOLN
50 | INTRAVENOUS | Status: AC
Start: 2024-03-11 — End: 2024-03-11

## 2024-03-11 MED ORDER — EPHEDRINE SULFATE (PRESSORS) 50 MG/ML IV SOLN
50 | Freq: Once | INTRAVENOUS | Status: DC | PRN
Start: 2024-03-11 — End: 2024-03-11
  Administered 2024-03-11: 10:00:00 35 via INTRAMUSCULAR

## 2024-03-11 MED ORDER — MISOPROSTOL 200 MCG PO TABS
200 | ORAL | Status: AC
Start: 2024-03-11 — End: 2024-03-11

## 2024-03-11 MED ORDER — MORPHINE SULFATE (PF) 1 MG/ML IJ SOLN
1 | Freq: Once | INTRAMUSCULAR | Status: DC | PRN
Start: 2024-03-11 — End: 2024-03-11
  Administered 2024-03-11: 09:00:00 .15 via INTRASPINAL

## 2024-03-11 MED ORDER — DEXMEDETOMIDINE HCL 200 MCG/2ML IV SOLN
200 | INTRAVENOUS | Status: AC
Start: 2024-03-11 — End: 2024-03-11

## 2024-03-11 MED ORDER — AZITHROMYCIN 500 MG IV SOLR
500 | INTRAVENOUS | Status: AC
Start: 2024-03-11 — End: 2024-03-11
  Administered 2024-03-11: 09:00:00 500 mg via INTRAVENOUS

## 2024-03-11 MED ORDER — DEXAMETHASONE SODIUM PHOSPHATE 4 MG/ML IJ SOLN
4 | Freq: Once | INTRAMUSCULAR | Status: DC | PRN
Start: 2024-03-11 — End: 2024-03-11
  Administered 2024-03-11: 09:00:00 4 via INTRAVENOUS

## 2024-03-11 MED ORDER — MISOPROSTOL 200 MCG PO TABS
200 | ORAL | Status: DC | PRN
Start: 2024-03-11 — End: 2024-03-14

## 2024-03-11 MED ORDER — DEXMEDETOMIDINE HCL 200 MCG/2ML IV SOLN
200 | Freq: Once | INTRAVENOUS | Status: DC | PRN
Start: 2024-03-11 — End: 2024-03-11
  Administered 2024-03-11 (×2): 10 via INTRAVENOUS

## 2024-03-11 MED ORDER — SODIUM CHLORIDE (PF) 0.9 % IJ SOLN
0.9 | INTRAMUSCULAR | Status: DC | PRN
Start: 2024-03-11 — End: 2024-03-14

## 2024-03-11 MED ORDER — ONDANSETRON HCL 4 MG/2ML IJ SOLN
4 | Freq: Once | INTRAMUSCULAR | Status: DC | PRN
Start: 2024-03-11 — End: 2024-03-14

## 2024-03-11 MED ORDER — DEXAMETHASONE SODIUM PHOSPHATE 4 MG/ML IJ SOLN
4 | INTRAMUSCULAR | Status: AC
Start: 2024-03-11 — End: 2024-03-11

## 2024-03-11 MED ORDER — NORMAL SALINE FLUSH 0.9 % IV SOLN
0.9 | Freq: Two times a day (BID) | INTRAVENOUS | Status: DC
Start: 2024-03-11 — End: 2024-03-14

## 2024-03-11 MED ORDER — MORPHINE SULFATE (PF) 1 MG/ML IJ SOLN
1 | Freq: Once | INTRAMUSCULAR | Status: DC | PRN
Start: 2024-03-11 — End: 2024-03-11

## 2024-03-11 MED ORDER — ONDANSETRON 4 MG PO TBDP
4 | Freq: Three times a day (TID) | ORAL | Status: DC | PRN
Start: 2024-03-11 — End: 2024-03-14
  Administered 2024-03-13 – 2024-03-14 (×2): 4 mg via ORAL

## 2024-03-11 MED ORDER — TETANUS-DIPHTH-ACELL PERTUSSIS 5-2.5-18.5 LF-MCG/0.5 IM SUSY
5-2.5-18.5 | INTRAMUSCULAR | Status: AC
Start: 2024-03-11 — End: 2024-03-13
  Administered 2024-03-13: 10:00:00 0.5 mL via INTRAMUSCULAR

## 2024-03-11 MED ORDER — KETOROLAC TROMETHAMINE 30 MG/ML IJ SOLN
30 | Freq: Once | INTRAMUSCULAR | Status: DC | PRN
Start: 2024-03-11 — End: 2024-03-11
  Administered 2024-03-11: 10:00:00 30 via INTRAVENOUS

## 2024-03-11 MED ORDER — BETAMETHASONE SOD PHOS & ACET 6 (3-3) MG/ML IJ SUSP
6 | INTRAMUSCULAR | Status: DC
Start: 2024-03-11 — End: 2024-03-11
  Administered 2024-03-11: 04:00:00 12 mg via INTRAMUSCULAR

## 2024-03-11 MED ORDER — FENTANYL CITRATE (PF) 100 MCG/2ML IJ SOLN
100 | INTRAMUSCULAR | Status: AC
Start: 2024-03-11 — End: 2024-03-11

## 2024-03-11 MED ORDER — MAGNESIUM SULFATE 4 GM/100ML IV SOLN
4 | Freq: Once | INTRAVENOUS | Status: AC
Start: 2024-03-11 — End: 2024-03-10
  Administered 2024-03-11: 03:00:00 4000 mg via INTRAVENOUS

## 2024-03-11 MED ORDER — FENTANYL CITRATE (PF) 100 MCG/2ML IJ SOLN
100 | INTRAMUSCULAR | Status: DC | PRN
Start: 2024-03-11 — End: 2024-03-14

## 2024-03-11 MED ORDER — OXYTOCIN-LACTATED RINGERS 30 UNIT/500ML IV SOLN
30 | INTRAVENOUS | Status: DC | PRN
Start: 2024-03-11 — End: 2024-03-11
  Administered 2024-03-11: 09:00:00 909 via INTRAVENOUS

## 2024-03-11 MED ORDER — STERILE WATER FOR INJECTION (MIXTURES ONLY)
1 | Freq: Once | INTRAMUSCULAR | Status: AC
Start: 2024-03-11 — End: 2024-03-11
  Administered 2024-03-11: 08:00:00 2000 mg via INTRAVENOUS

## 2024-03-11 MED ORDER — SODIUM CHLORIDE 0.9 % IV SOLN
0.9 | INTRAVENOUS | Status: DC | PRN
Start: 2024-03-11 — End: 2024-03-14

## 2024-03-11 MED ORDER — FENTANYL CITRATE (PF) 100 MCG/2ML IJ SOLN
100 | Freq: Once | INTRAMUSCULAR | Status: DC | PRN
Start: 2024-03-11 — End: 2024-03-11
  Administered 2024-03-11: 06:00:00 100 via EPIDURAL

## 2024-03-11 MED ORDER — NORMAL SALINE FLUSH 0.9 % IV SOLN
0.9 | Freq: Two times a day (BID) | INTRAVENOUS | Status: DC
Start: 2024-03-11 — End: 2024-03-14
  Administered 2024-03-12: 01:00:00 10 mL via INTRAVENOUS

## 2024-03-11 MED ORDER — OXYCODONE HCL 5 MG PO TABS
5 | ORAL | Status: DC | PRN
Start: 2024-03-11 — End: 2024-03-14
  Administered 2024-03-13 – 2024-03-14 (×3): 5 mg via ORAL

## 2024-03-11 MED ORDER — SODIUM CHLORIDE 0.9 % IV SOLN (ADDEASE)
0.9 | Freq: Once | INTRAVENOUS | Status: DC | PRN
Start: 2024-03-11 — End: 2024-03-14

## 2024-03-11 MED ORDER — ONDANSETRON HCL 4 MG/2ML IJ SOLN
4 | Freq: Four times a day (QID) | INTRAMUSCULAR | Status: DC | PRN
Start: 2024-03-11 — End: 2024-03-14

## 2024-03-11 MED ORDER — ONDANSETRON HCL 4 MG/2ML IJ SOLN
4 | Freq: Four times a day (QID) | INTRAMUSCULAR | Status: DC | PRN
Start: 2024-03-11 — End: 2024-03-11
  Administered 2024-03-11: 09:00:00 4 mg via INTRAVENOUS

## 2024-03-11 MED ORDER — PHENYLEPHRINE HCL (PRESSORS) 10 MG/ML IV SOLN
10 | INTRAVENOUS | Status: AC
Start: 2024-03-11 — End: 2024-03-11

## 2024-03-11 MED ORDER — PHENYLEPHRINE HCL (PRESSORS) 10 MG/ML IV SOLN
10 | Freq: Once | INTRAVENOUS | Status: DC | PRN
Start: 2024-03-11 — End: 2024-03-11
  Administered 2024-03-11: 09:00:00 120 via INTRAVENOUS

## 2024-03-11 MED ORDER — MORPHINE SULFATE (PF) 1 MG/ML IJ SOLN
1 | INTRAMUSCULAR | Status: AC
Start: 2024-03-11 — End: 2024-03-11

## 2024-03-11 MED ORDER — PENICILLIN G POTASSIUM IN NS 2500000 UNIT/100 ML IVPB
2500000 | INTRAVENOUS | Status: DC
Start: 2024-03-11 — End: 2024-03-11

## 2024-03-11 MED ORDER — METHYLERGONOVINE MALEATE 0.2 MG/ML IJ SOLN
0.2 | INTRAMUSCULAR | Status: DC | PRN
Start: 2024-03-11 — End: 2024-03-14

## 2024-03-11 MED ORDER — BUPIVACAINE HCL (PF) 0.25 % IJ SOLN
0.25 | Freq: Once | INTRAMUSCULAR | Status: DC | PRN
Start: 2024-03-11 — End: 2024-03-11
  Administered 2024-03-11: 06:00:00 3 via EPIDURAL

## 2024-03-11 MED ORDER — LIDOCAINE-EPINEPHRINE (PF) 2 %-1:200000 IJ SOLN
2 | INTRAMUSCULAR | Status: AC
Start: 2024-03-11 — End: 2024-03-11

## 2024-03-11 MED ORDER — SODIUM CHLORIDE 0.9 % IV SOLN
0.9 | Freq: Once | INTRAVENOUS | Status: DC
Start: 2024-03-11 — End: 2024-03-11

## 2024-03-11 MED ORDER — ONDANSETRON HCL 4 MG/2ML IJ SOLN
4 | Freq: Once | INTRAMUSCULAR | Status: DC | PRN
Start: 2024-03-11 — End: 2024-03-11
  Administered 2024-03-11: 09:00:00 4 via INTRAVENOUS

## 2024-03-11 MED ORDER — OXYCODONE HCL 5 MG PO TABS
5 | ORAL | Status: DC | PRN
Start: 2024-03-11 — End: 2024-03-14
  Administered 2024-03-12 – 2024-03-13 (×3): 10 mg via ORAL

## 2024-03-11 MED ORDER — KETOROLAC TROMETHAMINE 30 MG/ML IJ SOLN
30 | INTRAMUSCULAR | Status: AC
Start: 2024-03-11 — End: 2024-03-11

## 2024-03-11 MED ORDER — HYDROMORPHONE HCL PF 1 MG/ML IJ SOLN
1 | INTRAMUSCULAR | Status: DC | PRN
Start: 2024-03-11 — End: 2024-03-14

## 2024-03-11 MED ORDER — CEFAZOLIN SODIUM 1 G IJ SOLR
1 | Freq: Three times a day (TID) | INTRAMUSCULAR | Status: DC
Start: 2024-03-11 — End: 2024-03-11

## 2024-03-11 MED ORDER — CALCIUM GLUCONATE 10 % IV SOLN
10 | INTRAVENOUS | Status: DC | PRN
Start: 2024-03-11 — End: 2024-03-14

## 2024-03-11 MED ORDER — MORPHINE SULFATE (PF) 1 MG/ML IJ SOLN
1 | INTRAMUSCULAR | Status: DC | PRN
Start: 2024-03-11 — End: 2024-03-14

## 2024-03-11 MED ORDER — FENTANYL CITRATE (PF) 100 MCG/2ML IJ SOLN
100 | Freq: Once | INTRAMUSCULAR | Status: DC | PRN
Start: 2024-03-11 — End: 2024-03-11
  Administered 2024-03-11: 09:00:00 15 via INTRASPINAL

## 2024-03-11 MED ORDER — MEPERIDINE HCL 25 MG/ML IJ SOLN
25 | Freq: Once | INTRAMUSCULAR | Status: DC | PRN
Start: 2024-03-11 — End: 2024-03-14

## 2024-03-11 MED ORDER — LIDOCAINE HCL URETHRAL/MUCOSAL 2 % EX PRSY
2 | CUTANEOUS | Status: DC | PRN
Start: 2024-03-11 — End: 2024-03-14

## 2024-03-11 MED ORDER — MEASLES, MUMPS & RUBELLA VAC IJ SOLR
INTRAMUSCULAR | Status: AC
Start: 2024-03-11 — End: 2024-03-13
  Administered 2024-03-13: 10:00:00 0.5 mL via SUBCUTANEOUS

## 2024-03-11 MED ORDER — MAGNESIUM SULFATE 20000 MG/500 ML INFUSION
20 | INTRAVENOUS | Status: AC
Start: 2024-03-11 — End: 2024-03-12
  Administered 2024-03-11: 03:00:00 2000 mg/h via INTRAVENOUS

## 2024-03-11 MED ORDER — ACETAMINOPHEN 325 MG PO TABS
325 | ORAL | Status: DC | PRN
Start: 2024-03-11 — End: 2024-03-14

## 2024-03-11 MED ORDER — LIDOCAINE-EPINEPHRINE (PF) 2 %-1:200000 IJ SOLN
2 | Freq: Once | INTRAMUSCULAR | Status: DC | PRN
Start: 2024-03-11 — End: 2024-03-11
  Administered 2024-03-11: 06:00:00 3 via EPIDURAL

## 2024-03-11 MED ORDER — EPHEDRINE SULFATE (PRESSORS) 50 MG/ML IV SOLN
50 | Freq: Once | INTRAVENOUS | Status: DC | PRN
Start: 2024-03-11 — End: 2024-03-11

## 2024-03-11 MED ORDER — LACTATED RINGERS IV SOLN
INTRAVENOUS | Status: DC | PRN
Start: 2024-03-11 — End: 2024-03-11
  Administered 2024-03-11: 09:00:00 via INTRAVENOUS

## 2024-03-11 MED ORDER — KETOROLAC TROMETHAMINE 30 MG/ML IJ SOLN
30 | Freq: Four times a day (QID) | INTRAMUSCULAR | Status: AC
Start: 2024-03-11 — End: 2024-03-12
  Administered 2024-03-11 (×2): 30 mg via INTRAVENOUS

## 2024-03-11 MED ORDER — ONDANSETRON 4 MG PO TBDP
4 | Freq: Three times a day (TID) | ORAL | Status: DC | PRN
Start: 2024-03-11 — End: 2024-03-14

## 2024-03-11 MED ORDER — OXYCODONE HCL 5 MG PO TABS
5 | Freq: Once | ORAL | Status: DC | PRN
Start: 2024-03-11 — End: 2024-03-14

## 2024-03-11 MED ORDER — RHO D IMMUNE GLOBULIN 1500 UNITS IM (WRAPPER)
1500 | Freq: Once | Status: DC | PRN
Start: 2024-03-11 — End: 2024-03-14

## 2024-03-11 MED ORDER — FENTANYL-BUPIVACAINE-NACL 0.2-0.125-0.9 MG/100ML-% EP SOLN
0.2-0.125-0.9 | EPIDURAL | Status: DC
Start: 2024-03-11 — End: 2024-03-11
  Administered 2024-03-11: 06:00:00 10 mL/h via EPIDURAL

## 2024-03-11 MED ORDER — BUPIVACAINE IN DEXTROSE 0.75-8.25 % IT SOLN
0.75-8.25 | Freq: Once | INTRATHECAL | Status: DC | PRN
Start: 2024-03-11 — End: 2024-03-11
  Administered 2024-03-11: 09:00:00 1.5

## 2024-03-11 MED ORDER — SODIUM CHLORIDE 0.9 % IV SOLN
0.9 | INTRAVENOUS | Status: DC | PRN
Start: 2024-03-11 — End: 2024-03-11
  Administered 2024-03-11: 09:00:00 40 via INTRAVENOUS

## 2024-03-11 MED ORDER — LACTATED RINGERS IV SOLN
INTRAVENOUS | Status: DC
Start: 2024-03-11 — End: 2024-03-14
  Administered 2024-03-11: 03:00:00 75 via INTRAVENOUS

## 2024-03-11 MED ORDER — IBUPROFEN 400 MG PO TABS
400 | Freq: Three times a day (TID) | ORAL | Status: DC
Start: 2024-03-11 — End: 2024-03-14
  Administered 2024-03-12 – 2024-03-14 (×8): 800 mg via ORAL

## 2024-03-11 MED ORDER — DOCUSATE SODIUM 100 MG PO CAPS
100 | Freq: Two times a day (BID) | ORAL | Status: DC
Start: 2024-03-11 — End: 2024-03-14
  Administered 2024-03-12 – 2024-03-14 (×6): 100 mg via ORAL

## 2024-03-11 MED ORDER — MISOPROSTOL 200 MCG PO TABS
200 | ORAL | Status: DC | PRN
Start: 2024-03-11 — End: 2024-03-11
  Administered 2024-03-11: 09:00:00 600 via VAGINAL

## 2024-03-11 MED FILL — FENTANYL-BUPIVACAINE-NACL 0.2-0.125-0.9 MG/100ML-% EP SOLN: 0.2-0.125-0.9 MG/100ML-% | EPIDURAL | Qty: 100 | Fill #0

## 2024-03-11 MED FILL — ONDANSETRON HCL 4 MG/2ML IJ SOLN: 4 MG/2ML | INTRAMUSCULAR | Qty: 2 | Fill #0

## 2024-03-11 MED FILL — XYLOCAINE-MPF/EPINEPHRINE 2 %-1:200000 IJ SOLN: 2 %-1:00000 | INTRAMUSCULAR | Qty: 20 | Fill #0

## 2024-03-11 MED FILL — EPHEDRINE SULFATE (PRESSORS) 50 MG/ML IV SOLN: 50 mg/mL | INTRAVENOUS | Qty: 1 | Fill #0

## 2024-03-11 MED FILL — DURAMORPH 1 MG/ML IJ SOLN: 1 mg/mL | INTRAMUSCULAR | Qty: 2 | Fill #0

## 2024-03-11 MED FILL — KETOROLAC TROMETHAMINE 30 MG/ML IJ SOLN: 30 mg/mL | INTRAMUSCULAR | Qty: 1 | Fill #0

## 2024-03-11 MED FILL — FENTANYL CITRATE (PF) 100 MCG/2ML IJ SOLN: 100 MCG/2ML | INTRAMUSCULAR | Qty: 2 | Fill #0

## 2024-03-11 MED FILL — DURAMORPH 1 MG/ML IJ SOLN: 1 mg/mL | INTRAMUSCULAR | Qty: 10 | Fill #0

## 2024-03-11 MED FILL — NORMAL SALINE FLUSH 0.9 % IV SOLN: 0.9 % | INTRAVENOUS | Qty: 40 | Fill #0

## 2024-03-11 MED FILL — CELESTONE SOLUSPAN 6 (3-3) MG/ML IJ SUSP: 6 (3-3) MG/ML | INTRAMUSCULAR | Qty: 2 | Fill #0

## 2024-03-11 MED FILL — MEPERIDINE HCL 25 MG/ML IJ SOLN: 25 mg/mL | INTRAMUSCULAR | Qty: 1 | Fill #0

## 2024-03-11 MED FILL — MISOPROSTOL 200 MCG PO TABS: 200 ug | ORAL | Qty: 1 | Fill #0

## 2024-03-11 MED FILL — ACETAMINOPHEN EXTRA STRENGTH 500 MG PO TABS: 500 mg | ORAL | Qty: 2 | Fill #0

## 2024-03-11 MED FILL — MAGNESIUM SULFATE 20 GM/500ML IV SOLN: 20 GM/500ML | INTRAVENOUS | Qty: 500 | Fill #0

## 2024-03-11 MED FILL — DEXAMETHASONE SODIUM PHOSPHATE 4 MG/ML IJ SOLN: 4 mg/mL | INTRAMUSCULAR | Qty: 1 | Fill #0

## 2024-03-11 MED FILL — DURAMORPH 1 MG/ML IJ SOLN: 1 mg/mL | INTRAMUSCULAR | Qty: 4 | Fill #0

## 2024-03-11 MED FILL — MAGNESIUM SULFATE 4 GM/100ML IV SOLN: 4 GM/100ML | INTRAVENOUS | Qty: 100 | Fill #0

## 2024-03-11 MED FILL — PHENYLEPHRINE HCL (PRESSORS) 10 MG/ML IV SOLN: 10 mg/mL | INTRAVENOUS | Qty: 1 | Fill #0

## 2024-03-11 MED FILL — LACTATED RINGERS IV SOLN: INTRAVENOUS | Qty: 1000 | Fill #0

## 2024-03-11 MED FILL — CEFAZOLIN SODIUM 1 G IJ SOLR: 1 g | INTRAMUSCULAR | Qty: 2000 | Fill #0

## 2024-03-11 MED FILL — MEDELA TENDER CARE LANOLIN EX CREA: CUTANEOUS | Qty: 7 | Fill #0

## 2024-03-11 MED FILL — PENICILLIN G POTASSIUM IN NS 2500000 UNIT/100 ML IVPB: 2500000 [IU] | INTRAVENOUS | Qty: 100 | Fill #0

## 2024-03-11 MED FILL — TRANEXAMIC ACID 1000 MG/10ML IV SOLN: 1000 MG/10ML | INTRAVENOUS | Qty: 10 | Fill #0

## 2024-03-11 MED FILL — GLYDO 2 % EX PRSY: 2 % | CUTANEOUS | Qty: 6 | Fill #0

## 2024-03-11 MED FILL — AZITHROMYCIN 500 MG IV SOLR: 500 mg | INTRAVENOUS | Qty: 500 | Fill #0

## 2024-03-11 MED FILL — SENSORCAINE-MPF 0.25 % IJ SOLN: 0.25 % | INTRAMUSCULAR | Qty: 30 | Fill #0

## 2024-03-11 MED FILL — DEXMEDETOMIDINE HCL 200 MCG/2ML IV SOLN: 200 MCG/2ML | INTRAVENOUS | Qty: 2 | Fill #0

## 2024-03-11 MED FILL — OXYTOCIN-LACTATED RINGERS 30 UNIT/500ML IV SOLN: 30 UNIT/500ML | INTRAVENOUS | Qty: 500 | Fill #0

## 2024-03-11 NOTE — Progress Notes (Addendum)
"  Bedside and Verbal shift change report given to C. Leontine Gauss RN (cabin crew) by Kirke Sauers RN (offgoing nurse). Report included the following information Nurse Handoff Report, Index, Adult Overview, Intake/Output, MAR, Recent Results, and Event Log.     2145 - Patient off unit to NICU via wheelchair with mother.     2235 - Patient back to unit.    2320 - IV blown/causing pain prior to administering last Toradol  dose. Patient requesting to just take Motrin.     2346 - Motrin given.     9492 - Patient up to bathroom.     9484 - Patient back to bed. States pain is 7/10, very sore. Patient is tearful. Tylenol  and Roxicodone 10 mg given. Abdominal binder brought to bedside and patient educated about how to apply binder when she is ready/to call out if needed.     HAVEN.GURNEY - Patient pumping comfortably.   "

## 2024-03-11 NOTE — Progress Notes (Signed)
"  TRANSFER - IN REPORT:    Verbal report received from [facility] on [employer]  being received from L&D for routine progression of patient care      Report consisted of patient's Situation, Background, Assessment and   Recommendations(SBAR).     Information from the following report(s) Nurse Handoff Report, Adult Overview, Surgery Report, Intake/Output, MAR, and Event Log was reviewed with the receiving nurse.    Opportunity for questions and clarification was provided.      Assessment completed upon patient's arrival to unit and care assumed.     "

## 2024-03-11 NOTE — Progress Notes (Signed)
"  OB Hospitalist    Called to the bedside as pt has leaking of fluid  Nitrazine positive  Pt  with +nausea/vomiting  Cervix C/90/Bulging bag  V scan confirms  Breech  Will proceed with CD- discussed about Classical CD and the risks discussed bleeding, infection, risk of injury to bladder, bowel, extension of uterine incision, DVT, PE.  Pt verbalizes understanding and all questions answered.  NICU and Anesthesia team aware.      Casey Rafter MD    "

## 2024-03-11 NOTE — Anesthesia Postprocedure Evaluation (Signed)
"  Post-Anesthesia Evaluation and Assessment    Patient: Kelsey Ray MRN: [id number]  SSN: [id number]    Date of Birth: 1995/10/27  Age: 28 y.o.  Sex: female      Patient is status post Labor Epidural.     Cardiovascular Function/Vital Signs  Visit Vitals  BP (!) 150/75   Pulse 90   SpO2 96%       Complications related to anesthesia: None    Post-anesthesia assessment completed. No concerns    Signed By: Durell MARLA Kays, MD     March 11, 2024      "

## 2024-03-11 NOTE — Anesthesia Pre Procedure (Signed)
 "Department of Anesthesiology  Preprocedure Note       Name:  Kelsey Ray   Age:  28 y.o.  DOB:  1995/09/22                                          MRN:  [id number]         Date:  03/11/2024      Surgeon: * No surgeons listed *    Procedure: * No procedures listed *    Medications prior to admission:   Prior to Admission medications   Medication Sig Start Date End Date Taking? Authorizing Provider   acetaminophen  (TYLENOL ) 500 MG tablet Take 1 tablet by mouth every 4 hours as needed for Pain or Fever 02/12/24 02/26/24  Myrle Cough, MD   ondansetron  (ZOFRAN ) 4 MG tablet Take 1 tablet by mouth 3 times daily as needed for Nausea or Vomiting 01/13/22   Cyrena Shove, MD   dicyclomine  (BENTYL ) 20 MG tablet Take 1 tablet by mouth 4 times daily 01/13/22   Cyrena Shove, MD       Current medications:    Current Facility-Administered Medications   Medication Dose Route Frequency Provider Last Rate Last Admin    ePHEDrine 50 MG/ML injection             fentaNYL 2 mcg/mL BUPivacaine 0.125% in sodium chloride  0.9% 100 mL epidural infusion  1-15 mL/hr Epidural Continuous Emerita Berkemeier K, MD        naloxone 0.4 mg in 10 mL sodium chloride  syringe   IntraVENous PRN Corona Popovich K, MD        ePHEDrine injection 10 mg  10 mg IntraVENous Once PRN Demica Zook K, MD        nalbuphine (NUBAIN) injection 5 mg  5 mg IntraVENous Q4H PRN Jeri Jeanbaptiste K, MD        ondansetron  (ZOFRAN ) injection 4 mg  4 mg IntraVENous Q6H PRN Zamorah Ailes K, MD        lactated ringers  infusion   IntraVENous Continuous Toy Pen, MD 75 mL/hr at 03/10/24 2155 75 mL/hr at 03/10/24 2155    lactated ringers  bolus 500 mL  500 mL IntraVENous PRN Pradhan, Soma, MD        Or    lactated ringers  bolus 500 mL  500 mL IntraVENous PRN Toy Pen, MD        sodium chloride  flush 0.9 % injection 5-40 mL  5-40 mL IntraVENous 2 times per day Toy Pen, MD        sodium chloride  flush 0.9 % injection 5-40 mL  5-40 mL IntraVENous PRN Toy,  Soma, MD        0.9 % sodium chloride  infusion  25 mL IntraVENous PRN Pradhan, Soma, MD        ondansetron  (ZOFRAN -ODT) disintegrating tablet 4 mg  4 mg Oral Q8H PRN Toy Pen, MD        Or    ondansetron  (ZOFRAN ) injection 4 mg  4 mg IntraVENous Q6H PRN Toy Pen, MD        acetaminophen  (TYLENOL ) tablet 650 mg  650 mg Oral Q4H PRN Toy Pen, MD        Or    acetaminophen  (TYLENOL ) suppository 650 mg  650 mg Rectal Q4H PRN Toy Pen, MD        magnesium  sulfate (20000 mg/500mL infusion)  2,000 mg/hr  IntraVENous Continuous Toy Pen, MD 50 mL/hr at 03/10/24 2224 2,000 mg/hr at 03/10/24 2224    calcium  gluconate 10 % IntraVENous 1,000 mg  1,000 mg IntraVENous PRN Pradhan, Soma, MD        penicillin  G potassium 5 Million Units in sodium chloride  0.9 % 100 mL IVPB (Vial2Bag)  5 Million Units IntraVENous Once Pradhan, Soma, MD        Followed by    penicillin  G potassium 2.5 million units in 0.9% sodium chloride  100 mL IVPB  2.5 Million Units IntraVENous Q4H Toy Pen, MD        betamethasone  acetate-betamethasone  sodium phosphate  (CELESTONE ) injection 12 mg  12 mg IntraMUSCular Daily Pradhan, Soma, MD   12 mg at 03/10/24 2246    lidocaine  (XYLOCAINE ) 2 % uro-jet   Topical PRN Pradhan, Soma, MD           Allergies:    Allergies   Allergen Reactions    Latex Rash    Amoxicillin Rash    Pcn [Penicillins] Rash       Problem List:    Patient Active Problem List   Diagnosis Code    Preterm labor in second trimester with preterm delivery in second trimester, fetus 1 of multiple gestation O60.12X1       Past Medical History:        Diagnosis Date    Anxiety     Obesity     PCOS (polycystic ovarian syndrome)     PTSD (post-traumatic stress disorder)        Past Surgical History:        Procedure Laterality Date    MOUTH SURGERY         Social History:    Social History     Tobacco Use    Smoking status: Former     Types: Cigarettes    Smokeless tobacco: Not on file   Substance Use  Topics    Alcohol use: Not Currently                                Counseling given: Not Answered      Vital Signs (Current):   Vitals:    03/10/24 2215 03/10/24 2220 03/10/24 2300 03/10/24 2330   BP: (!) 119/59 (!) 118/58 136/65 (!) 150/75   Pulse: 87 84 71 90   SpO2: 96% 94% 93% 96%                                              BP Readings from Last 3 Encounters:   03/10/24 (!) 150/75   02/12/24 129/71   12/06/23 (!) 145/95       NPO Status:                                                                                 BMI:   Wt Readings from Last 3 Encounters:   02/12/24 106.9 kg (235 lb 10.8 oz)   12/06/23 96.3 kg (212 lb 4.9 oz)  01/12/22 131.1 kg (289 lb)     There is no height or weight on file to calculate BMI.    CBC:   Lab Results   Component Value Date/Time    WBC 13.6 03/10/2024 09:20 PM    RBC 3.72 03/10/2024 09:20 PM    HGB 11.5 03/10/2024 09:20 PM    HCT 32.7 03/10/2024 09:20 PM    MCV 87.9 03/10/2024 09:20 PM    RDW 12.3 03/10/2024 09:20 PM    PLT 317 03/10/2024 09:20 PM       CMP:   Lab Results   Component Value Date/Time    NA 136 03/10/2024 09:20 PM    K 4.3 03/10/2024 09:20 PM    CL 103 03/10/2024 09:20 PM    CO2 21 03/10/2024 09:20 PM    BUN 10 03/10/2024 09:20 PM    CREATININE 0.53 03/10/2024 09:20 PM    LABGLOM >90 03/10/2024 09:20 PM    LABGLOM 57 01/12/2022 10:39 PM    GLUCOSE 84 03/10/2024 09:20 PM    CALCIUM  9.2 03/10/2024 09:20 PM    BILITOT <0.2 03/10/2024 09:20 PM    ALKPHOS 63 03/10/2024 09:20 PM    AST 16 03/10/2024 09:20 PM    ALT 13 03/10/2024 09:20 PM       POC Tests: No results for input(s): POCGLU, POCNA, POCK, POCCL, POCBUN, POCHEMO, POCHCT in the last 72 hours.    Coags: No results found for: PROTIME, INR, APTT    HCG (If Applicable):   Lab Results   Component Value Date    PREGTESTUR Positive (A) 12/06/2023    PREGSERUM Negative 01/12/2022    HCGQUANT 105,541 12/06/2023        ABGs: No results found for: PHART, PO2ART, PCO2ART, HCO3ART, BEART,  O2SATART     Type & Screen (If Applicable):  Lab Results   Component Value Date    ABORH A POSITIVE 03/10/2024    LABANTI NEG 03/10/2024       Drug/Infectious Status (If Applicable):  No results found for: HIV, HEPCAB    COVID-19 Screening (If Applicable): No results found for: COVID19        Anesthesia Evaluation    Patient summary reviewed and Nursing notes reviewed      Airway:  Mallampati: II  TM distance: >3 FB   Neck ROM: full    Mouth opening: > = 3 FB   Dental:  normal exam         Pulmonary:   Negative Pulmonary ROS  breath sounds clear to auscultation           Cardiovascular:   Negative CV ROS  Exercise tolerance: good (>4 METS)            Rhythm: regular  Rate: normal                Neuro/Psych:    Negative Neuro/Psych ROS             GI/Hepatic/Renal:  Neg GI/Hepatic/Renal ROS          Endo/Other:  Negative Endo/Other ROS                    Abdominal:              Vascular:  negative vascular ROS.       Other Findings:            Anesthesia Plan      epidural     ASA 2  Anesthetic plan and risks discussed with patient.              Post-op pain plan if not by surgeon: continuous epidural          Jayel Scaduto K Jacqulene Huntley, MD   03/11/2024            "

## 2024-03-11 NOTE — Progress Notes (Signed)
"  Pt provided with breast pump and pumping supplies. Education reviewed on breast pump use, pumping schedule, and proper labeling of milk collection. Pt assisted with setting pump up and initiating first pumping session. Primary RN updated.   "

## 2024-03-11 NOTE — Op Note (Signed)
"  C-Section Operative Note    Name: Kelsey Ray   Medical Record Number: [id number]   Date of Birth: [birth year]  Today's Date: March 11, 2024      Pre-operative Diagnosis:   IUP@ 24 WGA Breech presentation  Active Labor  PPPROM    Post-operative Diagnosis:   As above  Breech   Single live born female Infant    Operation: Classical Procedure(s):  CESAREAN SECTION    Surgeon(s):  Toy Pen, MD  [place], RN- Assist    Anesthesia:Spinal    Prophylactic Antibiotics: Ancef and Zithromax  DVT Prophylaxis: Sequential Compression Devices     EBL:     Fetal Description: singleton     Birth Information:   Information for the patient's newborn:  Malkia, Nippert [[id number]]   @[id number] @    Umbilical Cord: 3 vessels present    Placenta:  manual removal    Specimens: Placenta           Complications:  uterine inertia    Procedure Detail:      After proper patient identification and consent, the patient was taken to the operating room, where spinal anesthesia was administered and found to be adequate. Foley catheter had been placed using sterile technique.  The patient was prepped and draped in the normal sterile fashion.The abdomen was entered using the Pfannenstiel technique. The peritoneum was entered sharply well superior to the bladder without any apparent injury.  A Vertical uterine incision was made with the scalpel and extended with blunt finger dissection. No extension into the cervix noted. The baby delivered as in  Breech presentation.After 30 secs. The cord was clamped and cut and the baby was handed off to Neonatal staff in attendance. Placenta was then removed from the uterus. The uterus was curettaged with a moist lap pad and cleared of all clots and debris. 600 mcg cytotec was placed in the uterine cavity.The vertical uterine incision was closed with 0 monocryl, double layer  in running locking fashion with good hemostasis assured followed by an embricating stitch. A third serosal layer was placed using a  running baseball stitch of 2-0 Vicryl reinforcing the repair. The posterior cul-de-sac was irrigated with warm normal saline. Good hemostasis was again reassured and the uterus was returned to the abdomen. The anterior pelvis was irrigated with warm normal saline and good hemostasis was again reassured throughout. The rectus muscles were checked for hemostasis.. The fascia was closed with 0 PDS in a running fashion. Good hemostasis was assured. Subcutaneous space was closed with 2-0 vicryl.The skin was closed with a Francis monocryl closure. The patient tolerated the procedure well. Sponge, lap, and needle counts were correct times three and the patient and baby were taken to recovery/postpartum room in stable condition.    Pen Toy, MD  March 11, 2024  4:56 AM                   "

## 2024-03-11 NOTE — Anesthesia Procedure Notes (Signed)
"  Epidural Block    Patient location during procedure: OB  Start time: 03/11/2024 12:40 AM  End time: 03/11/2024 12:47 AM  Reason for block: labor epidural  Staffing  Performed: anesthesiologist   Anesthesiologist: Marvis Durell POUR, MD  Performed by: Marvis Durell POUR, MD  Authorized by: Marvis Durell POUR, MD    Epidural  Patient position: sitting  Prep: DuraPrep  Patient monitoring: continuous pulse ox and frequent blood pressure checks  Approach: midline  Location: L3-4  Injection technique: LOR saline  Provider prep: mask and sterile gloves  Needle  Needle type: Tuohy   Needle gauge: 17 G  Needle length: 3.5 in  Needle insertion depth: 7 cm  Catheter type: end hole  Catheter size: 19 G  Catheter at skin depth: 11 cm  Test dose: negativeCatheter Secured: tegaderm and tape  Assessment  Hemodynamics: stable  Attempts: 1  Outcomes: uncomplicated and patient tolerated procedure well  Preanesthetic Checklist  Completed: patient identified, IV checked, site marked, risks and benefits discussed, surgical/procedural consents, equipment checked, pre-op evaluation, timeout performed, anesthesia consent given, oxygen available, monitors applied/VS acknowledged and fire risk safety assessment completed and verbalized          "

## 2024-03-12 LAB — CBC
Hematocrit: 31.8 % — ABNORMAL LOW (ref 35.0–47.0)
Hemoglobin: 10.8 g/dL — ABNORMAL LOW (ref 11.5–16.0)
MCH: 31.1 pg (ref 26.0–34.0)
MCHC: 34 g/dL (ref 30.0–36.5)
MCV: 91.6 FL (ref 80.0–99.0)
MPV: 9.9 FL (ref 8.9–12.9)
Nucleated RBCs: 0 /100{WBCs}
Platelets: 293 K/uL (ref 150–400)
RBC: 3.47 M/uL — ABNORMAL LOW (ref 3.80–5.20)
RDW: 12.5 % (ref 11.5–14.5)
WBC: 13.2 K/uL — ABNORMAL HIGH (ref 3.6–11.0)
nRBC: 0 K/uL (ref 0.00–0.01)

## 2024-03-12 MED ORDER — POLYETHYLENE GLYCOL 3350 17 G PO PACK
17 | Freq: Every day | ORAL | Status: DC
Start: 2024-03-12 — End: 2024-03-14
  Administered 2024-03-14: 14:00:00 17 g via ORAL

## 2024-03-12 MED FILL — DOCUSATE SODIUM 100 MG PO CAPS: 100 mg | ORAL | Qty: 1 | Fill #0

## 2024-03-12 MED FILL — KETOROLAC TROMETHAMINE 30 MG/ML IJ SOLN: 30 mg/mL | INTRAMUSCULAR | Qty: 1 | Fill #0

## 2024-03-12 MED FILL — ACETAMINOPHEN EXTRA STRENGTH 500 MG PO TABS: 500 mg | ORAL | Qty: 2 | Fill #0

## 2024-03-12 MED FILL — IBUPROFEN 400 MG PO TABS: 400 mg | ORAL | Qty: 2 | Fill #0

## 2024-03-12 MED FILL — OXYCODONE HCL 5 MG PO TABS: 5 mg | ORAL | Qty: 2 | Fill #0

## 2024-03-12 NOTE — Progress Notes (Signed)
"  Spiritual Health Progress Note  [place]      Room # 315/01    Name: Kelsey Ray           Age: 28 y.o.    Gender: female          MRN: [id number]  Religion: Christian       Preferred Language: English      Date: 03/12/24  Visit Time: Begin Time: 1140 End Time : 1223         Visit Summary:  With the patient completed an Advance Medical Directive (AMD) that reflects the patient's values and wishes for a time when they can not speak for themselves. The patient chose their mom, Kelsey Ray as their Primary Health Care Decision Maker, [facility]) and their partner, Kelsey Ray as their Secondary HCDM. Patient also completed Health Care Instructions. The patient is in good spirits and she informed chaplain that the baby is stable. Family or visitors were present during visit. Family/visitors included parent.    Referral/Consult From: Nurse  Encounter Overview/Reason: Advance Care Planning  Encounter Code:     Crisis (if applicable):    Service Provided For: Patient and family together     Patient was available.    Faith, Belief, Meaning:   Patient identifies as spiritual  Family/Friends Family/friends present: Mom at bedside  unable to assess at this time  Rituals (if applicable)      Importance and Influence:  Patient has spiritual/personal beliefs that influence decisions regarding their health  Family/Friends Other: Unable to assess    Community:  Patient   indicated that they feel well-supported  Family/Friends   unable to assess at this time     Assessment and Plan of Care:   Emotions Expressed by Patient:   Assessment: Hopeful, Interrupted family processes, Coping    Interventions by Chaplain:   Intervention: Active listening, Nurtured Hope, Doctor, Hospital (assurance of)/Blessing, Empowerment, Sustaining Presence/Ministry of presence     Result/ Response by Patient:   Outcome: Expressed Gratitude, Receptive, Coping, Peace, Optimistic, Engaged in conversation, Encouraged    Patient Plan of Care:   Plan and  Referrals  Plan/Referrals: Other (Comment) (Chaplain is available upon request)     Emotions Expressed by Spouse/Family/Friends:   calm  gratitude    Chaplain Interventions with Spouse/ Family/Friends include:   active listening  facilitated expression of thoughts and feelings  prayer  provided ministry of presence    Spouse/Family/Friends Plan of Care:   Spiritual care available upon referral.      Electronically signed by   Tedi Tery Kerry Resident  [facility]  [phone]  "

## 2024-03-12 NOTE — Progress Notes (Addendum)
"  Bedside and Verbal shift change report given to C. Leontine Gauss RN (cabin crew) by Jestine Lobos RN (offgoing nurse). Report included the following information Nurse Handoff Report, Index, Adult Overview, MAR, and Event Log.     1940 - Patient in NICU during BSSR. Mother of patient, Devere, in room.     2030 - Patient back to unit.     9976 - Patient to NICU via wheelchair.     9942 - Patient back to unit.    0429 - Roxicodone 5mg  given. Patient given vaccine information sheets. All questions answered.     9554 - Patient c/o nausea after returning from the bathroom.    0448 - Zofran  given.     0451 - MMR vaccine administered.    9543 - TDAP vaccine administered. Patient states nausea is improving and was possibly related to gas pain. Ambulation encouraged once feeling better.     0530 - Patient ambulating to NICU with colostrum.     9394 - Patient back to unit.     [id number] - After showering, patient stated she would not like to be woken during BSSR this morning.   "

## 2024-03-12 NOTE — Progress Notes (Signed)
"  Post-Operative C-Section Day 1    [place] [facility]         Information for the patient's newborn:  Moranda, Billiot [[id number]]   C-Section, Low Transverse Patient doing well without unusual complaints. Tolerating liquids, +flatus.  Pain manageable    Vitals:  BP 124/78   Pulse 59   Temp 98.2 F (36.8 C) (Oral)   Resp 18   SpO2 93%   Breastfeeding Unknown   Temp (24hrs), Avg:97.9 F (36.6 C), Min:97.5 F (36.4 C), Max:98.2 F (36.8 C)      Last 24hr Input/Output:    Intake/Output Summary (Last 24 hours) at 03/12/2024 1148  Last data filed at 03/11/2024 1400  Gross per 24 hour   Intake --   Output 850 ml   Net -850 ml          Exam:       Patient without distress.  Abdomen:soft, expected tenderness, fundus firm, wound dressing intact (PICO)     Lower extremities are nontender without edema. No cords    Labs:   Lab Results   Component Value Date/Time    WBC 13.2 03/12/2024 05:37 AM    WBC 13.6 03/10/2024 09:20 PM    WBC 17.5 02/12/2024 01:48 PM    WBC 10.6 12/06/2023 12:33 AM    WBC 16.4 01/12/2022 10:39 PM    HGB 10.8 03/12/2024 05:37 AM    HGB 11.5 03/10/2024 09:20 PM    HGB 12.3 02/12/2024 01:48 PM    HGB 12.2 12/06/2023 12:33 AM    HGB 13.1 01/12/2022 10:39 PM    HCT 31.8 03/12/2024 05:37 AM    HCT 32.7 03/10/2024 09:20 PM    HCT 35.1 02/12/2024 01:48 PM    HCT 35.1 12/06/2023 12:33 AM    HCT 39.1 01/12/2022 10:39 PM    PLT 293 03/12/2024 05:37 AM    PLT 317 03/10/2024 09:20 PM    PLT 342 02/12/2024 01:48 PM    PLT 332 12/06/2023 12:33 AM    PLT 388 01/12/2022 10:39 PM       Recent Results (from the past 24 hours)   CBC    Collection Time: 03/12/24  5:37 AM   Result Value Ref Range    WBC 13.2 (H) 3.6 - 11.0 K/uL    RBC 3.47 (L) 3.80 - 5.20 M/uL    Hemoglobin 10.8 (L) 11.5 - 16.0 g/dL    Hematocrit 68.1 (L) 35.0 - 47.0 %    MCV 91.6 80.0 - 99.0 FL    MCH 31.1 26.0 - 34.0 PG    MCHC 34.0 30.0 - 36.5 g/dL    RDW 87.4 88.4 - 85.4 %    Platelets 293 150 - 400 K/uL    MPV 9.9 8.9 - 12.9 FL    Nucleated RBCs 0.0 0  PER 100 WBC    nRBC 0.00 0.00 - 0.01 K/uL           Assessment: Post-Op day 1, stable  A POSITIVE  /RNI    Plan:   Routine post-operative care  Will need MMR PP     "

## 2024-03-12 NOTE — Lactation Note (Signed)
"  Infant born via C/S during the night to a G1P1 mom at 56 1/[redacted] weeks gestation. Mom has started pumping using the hospital grade pump. Evaluated flange sizes with mom and currently she is using a #24. Mom has a history of PCOS. She also states that she had previously (10 years ago) had nipple trauma on the left breast where the nipple was almost bitten off and was hanging by a thread. As a result, she does not have feeling in her left nipple. Encouraged her to be mindful of nipple damage due to not being able to feel when she has pain in the left nipple.     Infant admitted to NICU.  Pt will successfully establish breast milk supply by pumping with a hospital grade pump every 2-3 hours for approximately 20 minutes/8-10 x day with the correct size flange, and suction level for mother's comfort.  To maximize milk production, mom taught to incorporate breast massage and hand expression into pumping sessions.  All expressed breast milk (EBM) will be provided for infant use, in clean bottles/syringes for storage in NICU breastmilk refrigerator. Patient label with barcode,date and time applied to each container prior to transport to NICU. Proper cleaning of pump parts and good hand hygiene discussed. Mother is advised to rent a hospital grade pump to continue regimen at home. Progress of milk transition, pumping log, expected EBM volumes, care of engorged breasts discussed.The value of bonding with baby emphasized, strategies for participating in infant care, photos, footprints, touch, and holding skin to skin as soon as baby and mother are able have been shown to increase oxytocin levels.  The breast will be offered as baby is ready; with the goal of eventual transition to breastfeeding.     "

## 2024-03-12 NOTE — Progress Notes (Addendum)
"  1530: Bedside and Verbal shift change report given to G. Trudell, RN (cabin crew) by RONAL Gibes, RN (offgoing nurse). Report included the following information Nurse Handoff Report, Adult Overview, Surgery Report, Intake/Output, MAR, and Recent Results.     1730: Verbal shift change report given to L. Sharlynn, RN (cabin crew) by KANDICE. Trudell, RN physiological scientist). Report included the following information Nurse Handoff Report, Adult Overview, Surgery Report, Intake/Output, MAR, and Recent Results. Bedside report not completed d/t pt in NICU.     "

## 2024-03-12 NOTE — Plan of Care (Signed)
"    Problem: Pain  Goal: Verbalizes/displays adequate comfort level or baseline comfort level  Outcome: Progressing     Problem: ABCDS Injury Assessment  Goal: Absence of physical injury  Outcome: Progressing     Problem: Safety - Adult  Goal: Free from fall injury  Outcome: Progressing     Problem: Skin/Tissue Integrity  Goal: Skin integrity remains intact  Description: 1.  Monitor for areas of redness and/or skin breakdown  2.  Assess vascular access sites hourly  3.  Every 4-6 hours minimum:  Change oxygen saturation probe site  4.  Every 4-6 hours:  If on nasal continuous positive airway pressure, respiratory therapy assess nares and determine need for appliance change or resting period  Outcome: Progressing     Problem: Postpartum  Goal: Experiences normal postpartum course  Description:  Postpartum OB-Pregnancy care plan goal which identifies if the mother is experiencing a normal postpartum course  Outcome: Progressing  Goal: Appropriate maternal - newborn bonding  Description:  Postpartum OB-Pregnancy care plan goal which identifies if the mother and newborn are bonding appropriately  Outcome: Progressing  Goal: Establishment of infant feeding pattern  Description:  Postpartum OB-Pregnancy care plan goal which identifies if the mother is establishing a feeding pattern with their newborn  Outcome: Progressing  Goal: Incisions, wounds, or drain sites healing without S/S of infection  Outcome: Progressing     "

## 2024-03-12 NOTE — ACP (Advance Care Planning) (Signed)
"  With the patient completed an Advance Medical Directive (AMD) that reflects the patient's values and wishes for a time when they can not speak for themselves. The patient chose their mom, Brendaly Townsel as their Primary Health Care Decision Maker, [facility]) and their partner, Dorn Daring as their Secondary HCDM. Patient also completed Health Care Instructions. The patient is in good spirits and she informed chaplain that the baby is stable.        "

## 2024-03-12 NOTE — Progress Notes (Addendum)
"  Bedside and Verbal shift change report given to Hadassah Gibes RN (oncoming nurse) by Aleck Gauss RN (offgoing nurse). Report included the following information Nurse Handoff Report, Adult Overview, Surgery Report, Intake/Output, MAR, and Recent Results.    "

## 2024-03-13 LAB — CULTURE, STREP B SCREEN, VAGINAL/RECTAL

## 2024-03-13 MED FILL — ACETAMINOPHEN EXTRA STRENGTH 500 MG PO TABS: 500 mg | ORAL | Qty: 2 | Fill #0

## 2024-03-13 MED FILL — ONDANSETRON 4 MG PO TBDP: 4 mg | ORAL | Qty: 1 | Fill #0

## 2024-03-13 MED FILL — M-M-R II IJ SOLR: INTRAMUSCULAR | Qty: 0.5 | Fill #0

## 2024-03-13 MED FILL — IBUPROFEN 400 MG PO TABS: 400 mg | ORAL | Qty: 2 | Fill #0

## 2024-03-13 MED FILL — DOCUSATE SODIUM 100 MG PO CAPS: 100 mg | ORAL | Qty: 1 | Fill #0

## 2024-03-13 MED FILL — OXYCODONE HCL 5 MG PO TABS: 5 mg | ORAL | Qty: 2 | Fill #0

## 2024-03-13 MED FILL — OXYCODONE HCL 5 MG PO TABS: 5 mg | ORAL | Qty: 1 | Fill #0

## 2024-03-13 MED FILL — MIRALAX MIX-IN PAX 17 G PO PACK: 17 g | ORAL | Qty: 1 | Fill #0

## 2024-03-13 MED FILL — BOOSTRIX 5-2.5-18.5 LF-MCG/0.5 IM SUSY: 5-2.5-18.5 LF-MCG/0.5 | INTRAMUSCULAR | Qty: 0.5 | Fill #0

## 2024-03-13 NOTE — Progress Notes (Signed)
"  735- Bedside and Verbal shift change report given to T. Julus Kelley CHARITY FUNDRAISER (cabin crew) by Leontine MORTON RN (offgoing nurse). Report included the following information Nurse Handoff Report, Index, Adult Overview, Surgery Report, Intake/Output, MAR, Recent Results, and Med Rec Status.    "

## 2024-03-13 NOTE — Progress Notes (Signed)
"  Bedside shift change report given to Mo,Rn (oncoming nurse) by Taylor,RN (offgoing nurse). Report included the following information Surgery Report, Intake/Output, MAR, and Recent Results.    "

## 2024-03-13 NOTE — Progress Notes (Signed)
"  Post-Operative C-Section Day 2    Kelsey Ray     Information for the patient's newborn:  Kelsey Ray, Kelsey Ray [[id number]]   C-Section, Low Transverse Patient doing well without unusual complaints. Passing flatus, voiding and ambulating without difficulty. Tolerating regular diet. Pt pumping at this time. Notes that infant is doing well in the NICU.    Vitals:  BP 112/64   Pulse 50   Temp 98.4 F (36.9 C) (Oral)   Resp 14   SpO2 95%   Breastfeeding Unknown   Temp (24hrs), Avg:98 F (36.7 C), Min:97.5 F (36.4 C), Max:98.4 F (36.9 C)        Exam:      Patient without distress.               Abdomen: soft, expected tenderness, fundus firm                Wound incision clean, dry and intact               Lower extremities are nontender without edema.  No cords    Labs:   Lab Results   Component Value Date/Time    WBC 13.2 03/12/2024 05:37 AM    WBC 13.6 03/10/2024 09:20 PM    WBC 17.5 02/12/2024 01:48 PM    WBC 10.6 12/06/2023 12:33 AM    WBC 16.4 01/12/2022 10:39 PM    HGB 10.8 03/12/2024 05:37 AM    HGB 11.5 03/10/2024 09:20 PM    HGB 12.3 02/12/2024 01:48 PM    HGB 12.2 12/06/2023 12:33 AM    HGB 13.1 01/12/2022 10:39 PM    HCT 31.8 03/12/2024 05:37 AM    HCT 32.7 03/10/2024 09:20 PM    HCT 35.1 02/12/2024 01:48 PM    HCT 35.1 12/06/2023 12:33 AM    HCT 39.1 01/12/2022 10:39 PM    PLT 293 03/12/2024 05:37 AM    PLT 317 03/10/2024 09:20 PM    PLT 342 02/12/2024 01:48 PM    PLT 332 12/06/2023 12:33 AM    PLT 388 01/12/2022 10:39 PM       No results found for this or any previous visit (from the past 24 hours).      Assessment: Post-Op day 2, doing well  A+/RNI/ female infant- NICU    Plan:   1. Routine post-operative care  2. Lactation support as needed.  3. Encouraged OOB, pain mgmt.    "

## 2024-03-13 NOTE — Lactation Note (Signed)
"  Infant admitted to NICU.  Pt will successfully establish breast milk supply by pumping with a hospital grade pump every 2-3 hours for approximately 20 minutes/8-10 x day with the correct size flange, and suction level for mother's comfort.  To maximize milk production, mom taught to incorporate breast massage and hand expression into pumping sessions.  All expressed breast milk (EBM) will be provided for infant use, in clean bottles/syringes for storage in NICU breastmilk refrigerator. Patient label with barcode,date and time applied to each container prior to transport to NICU. Proper cleaning of pump parts and good hand hygiene discussed. Mother is advised to rent a hospital grade pump to continue regimen at home. Progress of milk transition, pumping log, expected EBM volumes, care of engorged breasts discussed.The value of bonding with baby emphasized, strategies for participating in infant care, photos, footprints, touch, and holding skin to skin as soon as baby and mother are able have been shown to increase oxytocin levels.  The breast will be offered as baby is ready; with the goal of eventual transition to breastfeeding.     Brought mother a hand pump, hydrogel pads,  collecting syringes, and lanolin.     "

## 2024-03-14 MED ORDER — OXYCODONE HCL 5 MG PO TABS
5 | ORAL_TABLET | ORAL | 0 refills | 5.00000 days | Status: AC | PRN
Start: 2024-03-14 — End: 2024-03-17

## 2024-03-14 MED ORDER — IBUPROFEN 800 MG PO TABS
800 | ORAL_TABLET | Freq: Three times a day (TID) | ORAL | 1 refills | 15.00000 days | Status: AC
Start: 2024-03-14 — End: ?

## 2024-03-14 MED FILL — DOCUSATE SODIUM 100 MG PO CAPS: 100 mg | ORAL | Qty: 1 | Fill #0

## 2024-03-14 MED FILL — IBUPROFEN 400 MG PO TABS: 400 mg | ORAL | Qty: 2 | Fill #0

## 2024-03-14 MED FILL — ACETAMINOPHEN EXTRA STRENGTH 500 MG PO TABS: 500 mg | ORAL | Qty: 2 | Fill #0

## 2024-03-14 MED FILL — MIRALAX MIX-IN PAX 17 G PO PACK: 17 g | ORAL | Qty: 1 | Fill #0

## 2024-03-14 MED FILL — ONDANSETRON 4 MG PO TBDP: 4 mg | ORAL | Qty: 1 | Fill #0

## 2024-03-14 MED FILL — OXYCODONE HCL 5 MG PO TABS: 5 mg | ORAL | Qty: 1 | Fill #0

## 2024-03-14 NOTE — Lactation Note (Signed)
"  Patient continues to pump for baby in the NICU. She is collecting breast milk each time. She knows to label the milk with the date and time before taking to the NICU for the baby. She rented a hospital grade breast pump to use at home. She has no questions for lactation before discharge.   "

## 2024-03-14 NOTE — Progress Notes (Addendum)
"  740- Bedside and Verbal shift change report given to T. Breawna Montenegro CHARITY FUNDRAISER (cabin crew) by CHRISTELLA. Ilah Museum/gallery Conservator). Report included the following information Nurse Handoff Report, Index, Adult Overview, Surgery Report, Intake/Output, MAR, Recent Results, and Med Rec Status.      750- Pt calling out saying she is about to throw up. RN at bedside, pt given emesis bag. Pt already using alcohol wipes for smell.     753- PO zofran  given. No emesis at this time.    1350- I have reviewed discharge instruction and reviewed medication times. Patient given copy of discharge instructions. Patient verbalizes understanding and denies any further questions at this time.    "

## 2024-03-14 NOTE — Discharge Instructions (Addendum)
"  Postpartum Support Groups  We know that all of us  are dealing with a tremendous amount of uncertainty, confusion and disruption to our daily lives, which may result in increased anxiety, depression and fear. If you are feeling unsettled or worse, please know that we are here to help. During this time of increased caution and care for one another, Postpartum Support [place]  ([facility]) is offering virtual support groups to ALL MOTHERS in [place]  regardless of the age of your child/children as a way to help weather this emotional storm together. Social support is an important part of self-care during this time of physical distancing.  Virtual postpartum support group meetings available at www.http://phillips.info/  Warm Line: (351) 602-5761    Breastfeeding Support Groups   1st and 3rd Wednesday of each month at [facility]. [place] from 11a-12  2nd and 4th Tuesday of each month at [facility] from 10-11a      [email or web address]    After Your Delivery Discharge Instructions    After Discharge Orders:  No future appointments.       Medical equipment: Breast pump     Call the Physician with any C-Section signs and symptoms:    Warning signs regarding incision:  Popping of stitches or staples  Foul smelling discharge or pus  More redness or streaks around incision than before    Incision care:  Keep incision covered  Change dressing PRN  No tub baths until OK'd by your Physician or Midwife  You may use warm or cold compresses on incision site     After your delivery - signs and symptoms to watch for:  Fever - Oral temperature greater than 100.4 degrees Fahrenheit  Foul-smelling vaginal discharge  Headache unrelieved by pain meds  Difficulty urinating  Breasts reddened, hard, hot to the touch  Nipple discharge which is foul-smelling or contains pus  Increased pain at the site of the surgical incision  Difficulty breathing with or without chest pain  New calf pain especially if only on one  side  Sudden, continuing increased vaginal bleeding with or without clots  Unrelieved feelings of:  Inability to cope  Sadness  Anxiety  Lack of interest in baby  Insomnia  Crying     What to do at home:  See patient education handouts for full information  Resume activity gradually   Don't lift anything heavier than baby and carrier until OK'd by your Physician or Midwife  No sex until OK'd by your Physician or Midwife  Take care of yourself by sleeping/resting as much as possible  Eat regular nutritious meals  Let someone else care for you, your baby, and housework as much as possible   Take pain medication as prescribed whenever you need them  Wear compression stockings if prescribed   To avoid/relieve constipation take stool softeners if advised   Drink lots of water /fruit juices  Increase fiber in your diet  Breast care: Wear support bra 24/7; use lanolin ointment/cream as needed     Refer to Newborn Discharge Instructions for problems or follow-up regarding  nursing  "

## 2024-03-14 NOTE — Discharge Summary (Signed)
 "Obstetrical Discharge Summary     Name: Kelsey Ray MRN: [id number]  SSN: [id number]    Date of Birth: 12-07-1995  Age: 28 y.o.  Sex: female      Allergies: Latex, Amoxicillin, and Pcn [penicillins]    Admit Date: 03/10/2024    Discharge Date: 03/14/2024     Admitting Physician: Casey Rafter, MD     Attending Physician:  Hall Knee, DO     * Admission Diagnoses: Preterm labor in second trimester with preterm delivery in second trimester, fetus 1 of multiple gestation [O60.12X1]    * Discharge Diagnoses:   Kelsey Ray, Kelsey Ray [[id number]]      Events of Labor    Preterm labor?: Yes  Antenatal steroids?: Partial Course  Cervical ripening date/time:     Antibiotics received during labor?: Yes  Rupture date/time: 03/11/24 03:15   Rupture type: SROM  Fluid color: Clear  Induction: None  Augmentation: None  Labor complications: Preterm Labor                      Additional Diagnoses:    Lab Results   Component Value Date/Time    ABORH A POSITIVE 03/10/2024 11:36 PM      Immunization History   Administered Date(s) Administered    MMR, PRIORIX, M-M-R II, (age 51m+), SC, 0.5mL 03/13/2024    TDaP, ADACEL (age 8y-64y), BOOSTRIX (age 10y+), IM, 0.5mL 03/13/2024       * Procedures: Primary classical csection  Procedure(s):  CESAREAN SECTION           * Discharge Condition: Good    [facility] Course: Normal hospital course following the delivery.    * Disposition: Home    Discharge Medications:      Medication List        START taking these medications      ibuprofen 800 MG tablet  Commonly known as: ADVIL;MOTRIN  Take 1 tablet by mouth in the morning and 1 tablet at noon and 1 tablet in the evening.     oxyCODONE 5 MG immediate release tablet  Commonly known as: ROXICODONE  Take 1 tablet by mouth every 4 hours as needed for Pain for up to 3 days. Max Daily Amount: 30 mg            CONTINUE taking these medications      acetaminophen  500 MG tablet  Commonly known as: TYLENOL   Take 1 tablet by mouth every 4 hours as needed for  Pain or Fever     dicyclomine  20 MG tablet  Commonly known as: BENTYL   Take 1 tablet by mouth 4 times daily     ondansetron  4 MG tablet  Commonly known as: ZOFRAN   Take 1 tablet by mouth 3 times daily as needed for Nausea or Vomiting               Where to Get Your Medications        These medications were sent to [id number] - [place], [place] - [address] - P [phone] [address] [phone]  [address], [place] [address]      Phone: [phone]   ibuprofen 800 MG tablet  oxyCODONE 5 MG immediate release tablet         * Follow-up Care/Patient Instructions:  Activity: no sex for 6 weeks and no heavy lifting for 6 weeks  Diet: regular diet  Wound Care: keep wound clean and dry and reinforce dressing PRN    No  follow-ups on file.          "

## 2024-03-14 NOTE — Progress Notes (Signed)
"  Post-Operative C-Section Day 3    Kelsey Ray       Patient doing well without significant complaints. Tolerating diet, passing flatus, voiding and ambulating without difficulty. Pumping is going well for infant in NICU.    Vitals:  BP 128/86   Pulse (!) 48   Temp 97.7 F (36.5 C) (Oral)   Resp 18   SpO2 97%   Breastfeeding Unknown   Temp (24hrs), Avg:97.8 F (36.6 C), Min:97.7 F (36.5 C), Max:97.9 F (36.6 C)        Exam:      Patient without distress.               Abdomen: soft, expected tenderness, fundus firm                Wound incision clean, dry and intact. PICO dressing intact.               Lower extremities are nontender without edema.  No cords    Labs:   Lab Results   Component Value Date/Time    WBC 13.2 03/12/2024 05:37 AM    WBC 13.6 03/10/2024 09:20 PM    WBC 17.5 02/12/2024 01:48 PM    WBC 10.6 12/06/2023 12:33 AM    WBC 16.4 01/12/2022 10:39 PM    HGB 10.8 03/12/2024 05:37 AM    HGB 11.5 03/10/2024 09:20 PM    HGB 12.3 02/12/2024 01:48 PM    HGB 12.2 12/06/2023 12:33 AM    HGB 13.1 01/12/2022 10:39 PM    HCT 31.8 03/12/2024 05:37 AM    HCT 32.7 03/10/2024 09:20 PM    HCT 35.1 02/12/2024 01:48 PM    HCT 35.1 12/06/2023 12:33 AM    HCT 39.1 01/12/2022 10:39 PM    PLT 293 03/12/2024 05:37 AM    PLT 317 03/10/2024 09:20 PM    PLT 342 02/12/2024 01:48 PM    PLT 332 12/06/2023 12:33 AM    PLT 388 01/12/2022 10:39 PM       No results found for this or any previous visit (from the past 24 hours).      Assessment: Post-Op day 3, doing well  A+/RI/ female infant- 24 wks in NICU    Plan:   1. Discharge home today  2. Instructions given- pelvic rest, restrictions on driving and lifting, wound care instructions, follow-up  3. Discharge Medications: see discharge instruction sheet  4. Incision and mood check in 1 week.  "
